# Patient Record
Sex: Female | Born: 2000 | Race: White | Hispanic: No | Marital: Single | Smoking: Never smoker
Health system: Southern US, Community
[De-identification: ages and names within clinical notes are randomized; demographics above are authoritative.]

## PROBLEM LIST (undated history)

## (undated) DIAGNOSIS — K219 Gastro-esophageal reflux disease without esophagitis: Secondary | ICD-10-CM

## (undated) DIAGNOSIS — J45909 Unspecified asthma, uncomplicated: Secondary | ICD-10-CM

## (undated) DIAGNOSIS — F419 Anxiety disorder, unspecified: Secondary | ICD-10-CM

## (undated) DIAGNOSIS — F32A Depression, unspecified: Secondary | ICD-10-CM

## (undated) DIAGNOSIS — K529 Noninfective gastroenteritis and colitis, unspecified: Secondary | ICD-10-CM

## (undated) HISTORY — DX: Anxiety disorder, unspecified: F41.9

## (undated) HISTORY — PX: WISDOM TOOTH EXTRACTION: SHX21

---
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
## 2021-09-16 ENCOUNTER — Ambulatory Visit
Admission: EM | Admit: 2021-09-16 | Discharge: 2021-09-16 | Disposition: A | Discharge status: Home / Self Care | Payer: Self-pay

## 2021-09-16 ENCOUNTER — Other Ambulatory Visit: Payer: Self-pay

## 2021-09-16 DIAGNOSIS — J029 Acute pharyngitis, unspecified: Secondary | ICD-10-CM | POA: Insufficient documentation

## 2021-09-16 DIAGNOSIS — B9689 Other specified bacterial agents as the cause of diseases classified elsewhere: Secondary | ICD-10-CM | POA: Insufficient documentation

## 2021-09-16 DIAGNOSIS — J028 Acute pharyngitis due to other specified organisms: Secondary | ICD-10-CM | POA: Insufficient documentation

## 2021-09-16 LAB — POCT RAPID STREP A (OFFICE): Rapid Strep A Screen: NEGATIVE

## 2021-09-16 MED ORDER — CEFDINIR 300 MG PO CAPS: 300.0000 mg | ORAL_CAPSULE | Freq: Two times a day (BID) | ORAL | 0 refills | Status: AC

## 2021-09-16 NOTE — ED Provider Notes (Signed)
[facility]    CSN: [id number] Arrival date & time: 09/16/21  0917    HISTORY  No chief complaint on file.  HPI Madison Vega is a 20 y.o. female. Pt reports having white spots on the back of her throat, she also has a sore throat, states she noticed the white patches last night but her throat is been a little sore for a while.  Patient states that around [place], a little over [place] ago[place] a [place] throat so went to a local urgent care, states they diagnosed her with streptococcal pharyngitis provide her with a prescription of azithromycin.  Patient states she never really got completely better.  Patient is afebrile on arrival today.  Vital signs are normal.  The history is provided by the patient.  History reviewed. No pertinent past medical history. There are no problems to display for this patient.  History reviewed. No pertinent surgical history. OB History   No obstetric history on file.    Home Medications    Prior to Admission medications   Medication Sig Start Date End Date Taking? Authorizing Provider  albuterol (VENTOLIN HFA) 108 (90 Base) MCG/ACT inhaler Inhale into the lungs every 6 (six) hours as needed for wheezing or shortness of breath.   Yes [provider]  cefdinir (OMNICEF) 300 MG capsule Take 1 capsule (300 mg total) by mouth 2 (two) times daily for 10 days. 09/16/21 09/26/21 Yes Theadora Rama Scales, PA-C  sertraline (ZOLOFT) 50 MG tablet Take 50 mg by mouth daily.   Yes [provider]  etonogestrel-ethinyl estradiol (NUVARING) 0.12-0.015 MG/24HR vaginal ring SMARTSIG:1 Ring Vaginal Once a Month 08/22/21   [provider]   Family History History reviewed. No pertinent family history. Social History Social History   Tobacco Use   Smoking status: Never   Smokeless tobacco: Never  Vaping Use   Vaping Use: Never used  Substance Use Topics   Alcohol use: Never   Drug use:  Never   Allergies   Patient has no allergy information on record.  Review of Systems Review of Systems Pertinent findings noted in history of present illness.   Physical Exam Triage Vital Signs ED Triage Vitals  Enc Vitals Group     BP 07/15/21 0827 (!) 147/82     Pulse Rate 07/15/21 0827 72     Resp 07/15/21 0827 18     Temp 07/15/21 0827 98.3 F (36.8 C)     Temp Source 07/15/21 0827 Oral     SpO2 07/15/21 0827 98 %     Weight --      Height --      Head Circumference --      Peak Flow --      Pain Score 07/15/21 0826 5     Pain Loc --      Pain Edu? --      Excl. in GC? --   No data found.  Updated Vital Signs BP 126/77 (BP Location: Right Arm)    Pulse 69    Temp 98.7 F (37.1 C) (Oral)    Resp 18    LMP 09/15/2021    SpO2 98%   Physical Exam Constitutional:      General: She is not in acute distress.    Appearance: She is well-developed. She is not ill-appearing or toxic-appearing.  HENT:     Head: Normocephalic and atraumatic.     Salivary Glands: Right salivary  gland is diffusely enlarged and tender. Left salivary gland is diffusely enlarged and tender.     Right Ear: Hearing and external ear normal.     Left Ear: Hearing and external ear normal.     Ears:     Comments: Bilateral EACs with mild erythema, bilateral TMs are normal    Nose: No mucosal edema, congestion or rhinorrhea.     Right Turbinates: Not enlarged, swollen or pale.     Left Turbinates: Not enlarged or swollen.     Right Sinus: No maxillary sinus tenderness or frontal sinus tenderness.     Left Sinus: No maxillary sinus tenderness or frontal sinus tenderness.     Mouth/Throat:     Lips: Pink. No lesions.     Mouth: Mucous membranes are moist. No oral lesions or angioedema.     Dentition: No gingival swelling.     Tongue: No lesions.     Palate: No mass.     Pharynx: Uvula midline. Pharyngeal swelling, oropharyngeal exudate and posterior oropharyngeal erythema present. No uvula swelling.      Tonsils: Tonsillar exudate (Significant white patches) present. 2+ on the right. 2+ on the left.     Comments: Tonsils are cryptic Eyes:     Extraocular Movements: Extraocular movements intact.     Conjunctiva/sclera: Conjunctivae normal.     Pupils: Pupils are equal, round, and reactive to light.  Neck:     Thyroid: No thyroid mass, thyromegaly or thyroid tenderness.     Trachea: Tracheal tenderness present. No abnormal tracheal secretions or tracheal deviation.     Comments: Voice is muffled Cardiovascular:     Rate and Rhythm: Normal rate and regular rhythm.     Pulses: Normal pulses.     Heart sounds: Normal heart sounds, S1 normal and S2 normal. No murmur heard.   No friction rub. No gallop.  Pulmonary:     Effort: Pulmonary effort is normal. No accessory muscle usage, prolonged expiration, respiratory distress or retractions.     Breath sounds: No stridor, decreased air movement or transmitted upper airway sounds. No decreased breath sounds, wheezing, rhonchi or rales.  Abdominal:     General: Bowel sounds are normal.     Palpations: Abdomen is soft.     Tenderness: There is generalized abdominal tenderness. There is no right CVA tenderness, left CVA tenderness or rebound. Negative signs include Murphy's sign.     Hernia: No hernia is present.  Musculoskeletal:        General: No tenderness. Normal range of motion.     Cervical back: Full passive range of motion without pain, normal range of motion and neck supple.     Right lower leg: No edema.     Left lower leg: No edema.  Lymphadenopathy:     Cervical: Cervical adenopathy present.     Right cervical: Superficial cervical adenopathy and deep cervical adenopathy present.     Left cervical: Superficial cervical adenopathy and deep cervical adenopathy present.  Skin:    General: Skin is warm and dry.     Findings: No erythema, lesion or rash.  Neurological:     General: No focal deficit present.     Mental Status: She  is alert and oriented to person, place, and time. Mental status is at baseline.  Psychiatric:        Mood and Affect: Mood normal.        Behavior: Behavior normal.        Thought Content: Thought  content normal.        Judgment: Judgment normal.    Visual Acuity Right Eye Distance:   Left Eye Distance:   Bilateral Distance:    Right Eye Near:   Left Eye Near:    Bilateral Near:     UC Couse / Diagnostics / Procedures:    EKG  Radiology No results found.  Procedures Procedures (including critical care time)  UC Diagnoses / Final Clinical Impressions(s)   I have reviewed the triage vital signs and the nursing notes.  Pertinent labs & imaging results that were available during my care of the patient were reviewed by me and considered in my medical decision making (see chart for details).   Final diagnoses:  Acute pharyngitis, unspecified etiology  Acute bacterial pharyngitis   Patient denies penicillin or amoxicillin allergy.  Patient states when she gets strep they usually give her amoxicillin, patient does not know why [place]  We will provide patient with a prescription for cefdinir today, patient advised to finish all as prescribed.  Patient advised to return for follow-up if she has incomplete resolution of her symptoms.  Rapid strep test today was negative, throat culture will be performed per protocol, patient advised to finish cefdinir even if throat culture is negative if she has immediate relief of her symptoms within the first 2 days of cefdinir.  ED Prescriptions     Medication Sig Dispense Auth. Provider   cefdinir (OMNICEF) 300 MG capsule Take 1 capsule (300 mg total) by mouth 2 (two) times daily for 10 days. 20 capsule Theadora Rama Scales, PA-C      PDMP not reviewed this encounter.  Pending results:  Labs Reviewed  CULTURE, GROUP A STREP [facility])  POCT RAPID STREP A (OFFICE)    Medications Ordered in  UC: Medications - No data to display  Disposition Upon Discharge:  Condition: stable for discharge home Home: take medications as prescribed; routine discharge instructions as discussed; follow up as advised.  Patient presented with an acute illness with associated systemic symptoms and significant discomfort requiring urgent management. In my opinion, this is a condition that a prudent lay person (someone who possesses an average knowledge of health and medicine) may potentially expect to result in complications if not addressed urgently such as respiratory distress, impairment of bodily function or dysfunction of bodily organs.   Routine symptom specific, illness specific and/or disease specific instructions were discussed with the patient and/or caregiver at length.   As such, the patient has been evaluated and assessed, work-up was performed and treatment was provided in alignment with urgent care protocols and evidence based medicine.  Patient/parent/caregiver has been advised that the patient may require follow up for further testing and treatment if the symptoms continue in spite of treatment, as clinically indicated and appropriate.  If the patient was tested for COVID-19, Influenza and/or RSV, then the patient/parent/guardian was advised to isolate at home pending the results of his/her diagnostic coronavirus test and potentially longer if theyre positive. I have also advised pt that if his/her COVID-19 test returns positive, it's recommended to self-isolate for at least 10 days after symptoms first appeared AND until fever-free for 24 hours without fever reducer AND other symptoms have improved or resolved. Discussed self-isolation recommendations as well as instructions for household member/close contacts as per the [facility] and [place] [facility]HS, and also gave patient the COVID packet with this information.  Patient/parent/caregiver has been advised to return to  the [facility] or PCP in 3-5 days if no better;  to PCP or the Emergency Department if new signs and symptoms develop, or if the current signs or symptoms continue to change or worsen for further workup, evaluation and treatment as clinically indicated and appropriate  The patient will follow up with their current PCP if and as advised. If the patient does not currently have a PCP we will assist them in obtaining one.   The patient may need specialty follow up if the symptoms continue, in spite of conservative treatment and management, for further workup, evaluation, consultation and treatment as clinically indicated and appropriate.  Patient/parent/caregiver verbalized understanding and agreement of plan as discussed.  All questions were addressed during visit.  Please see discharge instructions below for further details of plan.  Discharge Instructions:   Discharge Instructions      Based on my physical exam today and the history that you provided, I believe that you have bacterial pharyngitis.  Bacterial pharyngitis is most commonly caused by bacteria called group A Streptococcus but there are many other culprits.     Your rapid strep test today is negative.  Throat culture will be performed per our protocol.  The result of your throat culture will be posted to your MyChart once it is complete, this typically takes 3 to 5 days.     The rapid strep test that we performed in the office only catches 40% of known cases of group A streptococcal pharyngitis.  Additionally, and unfortunately, the throat culture that we perform here in the urgent care only evaluates for group A strep and not any of the other bacteria  that are known to cause bacterial pharyngitis.      Because you have significant swelling of both tonsils, significant redness of both tonsils and white patches on both tonsils, I have prescribed an antibiotic to treat you for bacterial pharyngitis which covers group A strep along with other known causative organisms.  Please take this  antibiotic as prescribed and do not skip any doses.  Once you have been on antibiotics for a full 24 hours, you are no longer considered contagious.       If you receive a phone call telling you that your throat culture is negative, I still strongly recommend that you complete your entire prescription of antibiotics regardless of the result of your throat culture, particularly if you begin to feel better within the first 48 hours of therapy.  The throat culture we perform here at urgent care only tests for group A strep and not any of the other bacteria that can also cause bacterial pharyngitis.    Please follow-up with your primary care provider in the next week to ten days for repeat evaluation if you have not had complete resolution of your symptoms.         This office note has been dictated using Teaching laboratory technician.  Unfortunately, and despite my best efforts, this method of dictation can sometimes lead to occasional typographical or grammatical errors.  I apologize in advance if this occurs.     Theadora Rama Scales, PA-C 09/16/21 1236

## 2021-09-16 NOTE — ED Triage Notes (Signed)
Pt reports having white spots on the back of her throat, she also has a sore throat.  Started: last night

## 2021-09-16 NOTE — Discharge Instructions (Addendum)
Based on my physical exam today and the history that you provided, I believe that you have bacterial pharyngitis.  Bacterial pharyngitis is most commonly caused by bacteria called group A Streptococcus but there are many other culprits.     Your rapid strep test today is negative.  Throat culture will be performed per our protocol.  The result of your throat culture will be posted to your MyChart once it is complete, this typically takes 3 to 5 days.     The rapid strep test that we performed in the office only catches 40% of known cases of group A streptococcal pharyngitis.  Additionally, and unfortunately, the throat culture that we perform here in the urgent care only evaluates for group A strep and not any of the other bacteria  that are known to cause bacterial pharyngitis.      Because you have significant swelling of both tonsils, significant redness of both tonsils and white patches on both tonsils, I have prescribed an antibiotic to treat you for bacterial pharyngitis which covers group A strep along with other known causative organisms.  Please take this antibiotic as prescribed and do not skip any doses.  Once you have been on antibiotics for a full 24 hours, you are no longer considered contagious.       If you receive a phone call telling you that your throat culture is negative, I still strongly recommend that you complete your entire prescription of antibiotics regardless of the result of your throat culture, particularly if you begin to feel better within the first 48 hours of therapy.  The throat culture we perform here at urgent care only tests for group A strep and not any of the other bacteria that can also cause bacterial pharyngitis.    Please follow-up with your primary care provider in the next week to ten days for repeat evaluation if you have not had complete resolution of your symptoms.

## 2021-09-18 LAB — CULTURE, GROUP A STREP (THRC)

## 2021-11-03 ENCOUNTER — Other Ambulatory Visit: Payer: Self-pay

## 2021-11-03 ENCOUNTER — Emergency Department (HOSPITAL_BASED_OUTPATIENT_CLINIC_OR_DEPARTMENT_OTHER)

## 2021-11-03 ENCOUNTER — Emergency Department (HOSPITAL_BASED_OUTPATIENT_CLINIC_OR_DEPARTMENT_OTHER)
Admission: EM | Admit: 2021-11-03 | Discharge: 2021-11-03 | Disposition: A | Discharge status: Home / Self Care | Attending: Emergency Medicine | Admitting: Emergency Medicine

## 2021-11-03 ENCOUNTER — Encounter (HOSPITAL_BASED_OUTPATIENT_CLINIC_OR_DEPARTMENT_OTHER): Payer: Self-pay | Admitting: Emergency Medicine

## 2021-11-03 DIAGNOSIS — R519 Headache, unspecified: Secondary | ICD-10-CM | POA: Diagnosis not present

## 2021-11-03 DIAGNOSIS — R1084 Generalized abdominal pain: Secondary | ICD-10-CM | POA: Insufficient documentation

## 2021-11-03 DIAGNOSIS — R197 Diarrhea, unspecified: Secondary | ICD-10-CM | POA: Diagnosis not present

## 2021-11-03 DIAGNOSIS — D72829 Elevated white blood cell count, unspecified: Secondary | ICD-10-CM | POA: Insufficient documentation

## 2021-11-03 DIAGNOSIS — J3489 Other specified disorders of nose and nasal sinuses: Secondary | ICD-10-CM | POA: Diagnosis not present

## 2021-11-03 DIAGNOSIS — R112 Nausea with vomiting, unspecified: Secondary | ICD-10-CM | POA: Diagnosis not present

## 2021-11-03 DIAGNOSIS — Z20822 Contact with and (suspected) exposure to covid-19: Secondary | ICD-10-CM | POA: Diagnosis not present

## 2021-11-03 DIAGNOSIS — E876 Hypokalemia: Secondary | ICD-10-CM | POA: Insufficient documentation

## 2021-11-03 HISTORY — DX: Unspecified asthma, uncomplicated: J45.909

## 2021-11-03 HISTORY — DX: Depression, unspecified: F32.A

## 2021-11-03 LAB — COMPREHENSIVE METABOLIC PANEL
ALT: 17 U/L (ref 0–44)
AST: 15 U/L (ref 15–41)
Albumin: 4.3 g/dL (ref 3.5–5.0)
Alkaline Phosphatase: 69 U/L (ref 38–126)
Anion gap: 14 (ref 5–15)
BUN: 18 mg/dL (ref 6–20)
CO2: 20 mmol/L — ABNORMAL LOW (ref 22–32)
Calcium: 9.5 mg/dL (ref 8.9–10.3)
Chloride: 105 mmol/L (ref 98–111)
Creatinine, Ser: 0.76 mg/dL (ref 0.44–1.00)
GFR, Estimated: >60 mL/min (ref >60)
Glucose, Bld: 114 mg/dL — ABNORMAL HIGH (ref 70–99)
Potassium: 3.4 mmol/L — ABNORMAL LOW (ref 3.5–5.1)
Sodium: 139 mmol/L (ref 135–145)
Total Bilirubin: 0.6 mg/dL (ref 0.3–1.2)
Total Protein: 7.4 g/dL (ref 6.5–8.1)

## 2021-11-03 LAB — URINALYSIS, ROUTINE W REFLEX MICROSCOPIC
Bilirubin Urine: NEGATIVE
Glucose, UA: NEGATIVE mg/dL
Ketones, ur: NEGATIVE mg/dL
Nitrite: NEGATIVE
Specific Gravity, Urine: 1.034 — ABNORMAL HIGH (ref 1.005–1.030)
pH: 6 (ref 5.0–8.0)

## 2021-11-03 LAB — CBC
HCT: 42.3 % (ref 36.0–46.0)
Hemoglobin: 14.2 g/dL (ref 12.0–15.0)
MCH: 28.2 pg (ref 26.0–34.0)
MCHC: 33.6 g/dL (ref 30.0–36.0)
MCV: 84.1 fL (ref 80.0–100.0)
Platelets: 330 10*3/uL (ref 150–400)
RBC: 5.03 MIL/uL (ref 3.87–5.11)
RDW: 12.7 % (ref 11.5–15.5)
WBC: 14 10*3/uL — ABNORMAL HIGH (ref 4.0–10.5)
nRBC: 0 % (ref 0.0–0.2)

## 2021-11-03 LAB — LIPASE, BLOOD: Lipase: <10 U/L — ABNORMAL LOW (ref 11–51)

## 2021-11-03 LAB — RESP PANEL BY RT-PCR (FLU A&B, COVID) ARPGX2
Influenza A by PCR: NEGATIVE
Influenza B by PCR: NEGATIVE
SARS Coronavirus 2 by RT PCR: NEGATIVE

## 2021-11-03 LAB — HCG, SERUM, QUALITATIVE: Preg, Serum: NEGATIVE

## 2021-11-03 MED ORDER — SODIUM CHLORIDE 0.9 % IV BOLUS
1000.0000 mL | Freq: Once | INTRAVENOUS | Status: AC
  Administered 2021-11-03: 1000 mL via INTRAVENOUS

## 2021-11-03 MED ORDER — IOHEXOL 300 MG/ML  SOLN
85.0000 mL | Freq: Once | INTRAMUSCULAR | Status: AC | PRN
  Administered 2021-11-03: 85 mL via INTRAVENOUS

## 2021-11-03 MED ORDER — ONDANSETRON 4 MG PO TBDP: 4.0000 mg | ORAL_TABLET | Freq: Three times a day (TID) | ORAL | 0 refills | Status: DC | PRN

## 2021-11-03 MED ORDER — ONDANSETRON HCL 4 MG/2ML IJ SOLN
4.0000 mg | Freq: Once | INTRAMUSCULAR | Status: AC
  Administered 2021-11-03: 4 mg via INTRAVENOUS
  Filled 2021-11-03: qty 2

## 2021-11-03 NOTE — ED Provider Notes (Signed)
[facility] EMERGENCY DEPT Provider Note   CSN: [id number] Arrival date & time: 11/03/21  1407     History  Chief Complaint  Patient presents with   Emesis    Madison Vega is a 21 y.o. female.  Here for evaluation nausea and vomiting and feeling unwell.  Symptoms began yesterday while she was at work.  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  Started with headache, chills, generalized abdominal pain as well as multiple episodes of NBNB emesis and diarrhea.  Last episode of emesis this morning however feels dehydrated.  No neck stiffness or neck rigidity.  No chest pain, shortness of breath, back pain, dysuria, hematuria.  No focal abdominal pain.  Denies concerns for pregnancy.  HPI     Home Medications Prior to Admission medications   Medication Sig Start Date End Date Taking? Authorizing Provider  etonogestrel-ethinyl estradiol (NUVARING) 0.12-0.015 MG/24HR vaginal ring SMARTSIG:1 Ring Vaginal Once a Month 08/22/21  Yes [provider]  ondansetron (ZOFRAN-ODT) 4 MG disintegrating tablet Take 1 tablet (4 mg total) by mouth every 8 (eight) hours as needed for nausea or vomiting. 11/03/21  Yes Shelda Truby A, PA-C  sertraline (ZOLOFT) 50 MG tablet Take 50 mg by mouth daily.   Yes [provider]  albuterol (VENTOLIN HFA) 108 (90 Base) MCG/ACT inhaler Inhale into the lungs every 6 (six) hours as needed for wheezing or shortness of breath.    [provider]      Allergies    Patient has no known allergies.    Review of Systems   Review of Systems  Constitutional:  Positive for activity change, appetite change, chills and fatigue.  HENT:  Positive for congestion, postnasal drip and rhinorrhea. Negative for sore throat, trouble swallowing and voice change.   Respiratory: Negative.    Cardiovascular: Negative.   Gastrointestinal:  Positive for abdominal pain (generalized), diarrhea, nausea and vomiting. Negative for  abdominal distention, anal bleeding, blood in stool, constipation and rectal pain.  All other systems reviewed and are negative.  Physical Exam Updated Vital Signs BP 111/60    Pulse (!) 110    Temp 98.3 F (36.8 C) (Oral)    Resp 20    Ht 4\' 11"  (1.499 m)    Wt 63.5 kg    SpO2 97%    BMI 28.28 kg/m  Physical Exam Vitals and nursing note reviewed.  Constitutional:      General: She is not in acute distress.    Appearance: She is well-developed. She is not ill-appearing, toxic-appearing or diaphoretic.  HENT:     Head: Atraumatic.     Nose: Nose normal.  Eyes:     Pupils: Pupils are equal, round, and reactive to light.  Cardiovascular:     Rate and Rhythm: Normal rate.     Pulses: Normal pulses.     Heart sounds: Normal heart sounds.  Pulmonary:     Effort: Pulmonary effort is normal. No respiratory distress.     Breath sounds: Normal breath sounds.  Abdominal:     General: Bowel sounds are normal. There is no distension.     Palpations: Abdomen is soft. There is no mass.     Tenderness: There is no abdominal tenderness. There is no right CVA tenderness, left CVA tenderness, guarding or rebound.     Hernia: No hernia is present.  Musculoskeletal:        General: No swelling, tenderness, deformity or signs of injury. Normal  range of motion.     Cervical back: Normal range of motion.     Right lower leg: No edema.     Left lower leg: No edema.  Skin:    General: Skin is warm and dry.     Capillary Refill: Capillary refill takes less than 2 seconds.  Neurological:     General: No focal deficit present.     Mental Status: She is alert and oriented to person, place, and time.     Cranial Nerves: No cranial nerve deficit.     Sensory: Sensation is intact.     Motor: Motor function is intact. No weakness.     Coordination: Coordination is intact.     Gait: Gait is intact.     Comments: CN 2-12 grossly intact Ambulatory without ataxic gait Intact sensation Equal strength   Psychiatric:        Mood and Affect: Mood normal.    ED Results / Procedures / Treatments   Labs (all labs ordered are listed, but only abnormal results are displayed) Labs Reviewed  LIPASE, BLOOD - Abnormal; Notable for the following components:      Result Value   Lipase <10 (*)    All other components within normal limits  COMPREHENSIVE METABOLIC PANEL - Abnormal; Notable for the following components:   Potassium 3.4 (*)    CO2 20 (*)    Glucose, Bld 114 (*)    All other components within normal limits  CBC - Abnormal; Notable for the following components:   WBC 14.0 (*)    All other components within normal limits  URINALYSIS, ROUTINE W REFLEX MICROSCOPIC - Abnormal; Notable for the following components:   Specific Gravity, Urine 1.034 (*)    Hgb urine dipstick TRACE (*)    Protein, ur TRACE (*)    Leukocytes,Ua SMALL (*)    All other components within normal limits  RESP PANEL BY RT-PCR (FLU A&B, COVID) ARPGX2  HCG, SERUM, QUALITATIVE    EKG None  Radiology CT Abdomen Pelvis W Contrast  Result Date: 11/03/2021 CLINICAL DATA:  Abdominal pain, acute, nonlocalized abd pain, emesis, fever EXAM: CT ABDOMEN AND PELVIS WITH CONTRAST TECHNIQUE: Multidetector CT imaging of the abdomen and pelvis was performed using the standard protocol following bolus administration of intravenous contrast. RADIATION DOSE REDUCTION: This exam was performed according to the departmental dose-optimization program which includes automated exposure control, adjustment of the mA and/or kV according to patient size and/or use of iterative reconstruction technique. CONTRAST:  32mL OMNIPAQUE IOHEXOL 300 MG/ML  SOLN COMPARISON:  None. FINDINGS: Lower chest: No acute abnormality. Hepatobiliary: No focal liver abnormality is seen. The gallbladder is unremarkable. Pancreas: Unremarkable. No pancreatic ductal dilatation or surrounding inflammatory changes. Spleen: Normal in size without focal abnormality.  Adrenals/Urinary Tract: Adrenal glands are unremarkable. No hydronephrosis or nephrolithiasis. The bladder is minimally distended. Stomach/Bowel: The stomach is within normal limits. There is no evidence of bowel obstruction.The appendix is normal. Vascular/Lymphatic: No significant vascular findings are present. No enlarged abdominal or pelvic lymph nodes. Reproductive: Unremarkable for age. Other: No abdominal wall hernia or abnormality. No abdominopelvic ascites. Musculoskeletal: No acute or significant osseous findings. IMPRESSION: No acute abdominopelvic abnormality.  Normal appendix. Electronically Signed   By: Maurine Simmering M.D.   On: 11/03/2021 16:52    Procedures Procedures    Medications Ordered in ED Medications  ondansetron (ZOFRAN) injection 4 mg (4 mg Intravenous Given 11/03/21 1435)  sodium chloride 0.9 % bolus 1,000 mL (1,000 mLs Intravenous  New Bag/Given 11/03/21 1436)  ondansetron (ZOFRAN) injection 4 mg (4 mg Intravenous Given 11/03/21 1600)  iohexol (OMNIPAQUE) 300 MG/ML solution 85 mL (85 mLs Intravenous Contrast Given 11/03/21 1645)    ED Course/ Medical Decision Making/ A&P    21 year old here for evaluation of feeling unwell which began yesterday.  She is afebrile, nonseptic, not ill-appearing.  Patient with headache, low-grade fever up to 100.3, nausea, vomiting, generalized abdominal pain, diarrhea, congestion and rhinorrhea.  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  No sudden onset thunderclap headache, neck stiffness, neck rigidity, have low suspicion for meningitis.  Her heart and lungs are clear.  Her abdomen is soft, nontender.  She denies any urinary complaints.  No obvious rash or lesion.  We will plan on labs, imaging and reassess  Labs personally viewed and interpreted: CBC leukocytosis at 14.0 CMP mild hypokalemia at 3.4 Lipase <10 UA negative for infection Preg negative COVID/Flu neg CTAP without acute abnormality  Patient reassessed.  Had  some persistent nausea, dry heaving, will give additional antiemetic.  While she did report an earlier mild headache I have low suspicion for acute intracranial abnormality as cause of her emesis.  Patient reassessed.  Symptoms improved, tolerating p.o. intake.  Suspect she likely has a viral illness.  I discussed her labs and imaging with her which were reassuring thus far.  We will treat symptomatically at home, will have her return for new or worsening symptoms otherwise follow-up with primary care provider  Patient is nontoxic, nonseptic appearing, in no apparent distress.  Patient's pain and other symptoms adequately managed in emergency department.  Fluid bolus given.  Labs, imaging and vitals reviewed.  Patient does not meet the SIRS or Sepsis criteria.  On repeat exam patient does not have a surgical abdomin and there are no peritoneal signs.  No indication of appendicitis, bowel obstruction, bowel perforation, cholecystitis, diverticulitis, PID, TOA, torsion or ectopic pregnancy.  Patient discharged home with symptomatic treatment and given strict instructions for follow-up with their primary care physician.  I have also discussed reasons to return immediately to the ER.  Patient expresses understanding and agrees with plan.                            Medical Decision Making Amount and/or Complexity of Data Reviewed External Data Reviewed: labs and notes. Labs: ordered. Decision-making details documented in ED Course. Radiology: ordered and independent interpretation performed. Decision-making details documented in ED Course.  Risk OTC drugs. Prescription drug management. Risk Details: Do not feel she needs additional labs, imaging, hospitalization at that time.           Final Clinical Impression(s) / ED Diagnoses Final diagnoses:  Nausea vomiting and diarrhea    Rx / DC Orders ED Discharge Orders          Ordered    ondansetron (ZOFRAN-ODT) 4 MG disintegrating tablet   Every 8 hours PRN        11/03/21 1729              Velmer Woelfel A, PA-C 11/03/21 1733    Long, Wonda Olds, MD 11/04/21 [phone]

## 2021-11-03 NOTE — ED Triage Notes (Signed)
Pt arrives to ED with c/o emesis. This started yesterday when pt left work. She does reports she had a migraine before the vomiting started. She reports >10 episodes. Associated symptoms include fever, nausea, diarrhea.

## 2021-11-03 NOTE — Discharge Instructions (Signed)
It was a pleasure taking care of you here in the emergency department today  Your work-up today was reassuring  I prescribed you some Zofran, take as needed for vomiting.  Return for new or worsening symptoms

## 2021-11-03 NOTE — ED Notes (Signed)
Patient transported to CT 

## 2021-11-03 NOTE — ED Notes (Signed)
Dc instructions and script reviewed with pt no questions or concerns at this time. Will follow up with pcp as needed.

## 2021-12-14 ENCOUNTER — Ambulatory Visit (HOSPITAL_COMMUNITY): Payer: Self-pay

## 2021-12-21 ENCOUNTER — Ambulatory Visit: Payer: Self-pay

## 2022-01-16 ENCOUNTER — Ambulatory Visit (HOSPITAL_COMMUNITY): Payer: Self-pay

## 2022-05-12 ENCOUNTER — Encounter (HOSPITAL_BASED_OUTPATIENT_CLINIC_OR_DEPARTMENT_OTHER): Payer: Self-pay

## 2022-05-12 ENCOUNTER — Other Ambulatory Visit: Payer: Self-pay

## 2022-05-12 ENCOUNTER — Emergency Department (HOSPITAL_BASED_OUTPATIENT_CLINIC_OR_DEPARTMENT_OTHER)
Admission: EM | Admit: 2022-05-12 | Discharge: 2022-05-12 | Disposition: A | Discharge status: Home / Self Care | Payer: No Typology Code available for payment source | Attending: Emergency Medicine | Admitting: Emergency Medicine

## 2022-05-12 ENCOUNTER — Emergency Department (HOSPITAL_BASED_OUTPATIENT_CLINIC_OR_DEPARTMENT_OTHER): Payer: No Typology Code available for payment source | Admitting: Radiology

## 2022-05-12 DIAGNOSIS — S43401A Unspecified sprain of right shoulder joint, initial encounter: Secondary | ICD-10-CM | POA: Diagnosis not present

## 2022-05-12 DIAGNOSIS — J45909 Unspecified asthma, uncomplicated: Secondary | ICD-10-CM | POA: Diagnosis not present

## 2022-05-12 DIAGNOSIS — Y990 Civilian activity done for income or pay: Secondary | ICD-10-CM | POA: Diagnosis not present

## 2022-05-12 DIAGNOSIS — X501XXA Overexertion from prolonged static or awkward postures, initial encounter: Secondary | ICD-10-CM | POA: Insufficient documentation

## 2022-05-12 DIAGNOSIS — Y9302 Activity, running: Secondary | ICD-10-CM | POA: Diagnosis not present

## 2022-05-12 DIAGNOSIS — S4991XA Unspecified injury of right shoulder and upper arm, initial encounter: Secondary | ICD-10-CM | POA: Diagnosis present

## 2022-05-12 MED ORDER — KETOROLAC TROMETHAMINE 30 MG/ML IJ SOLN
30.0000 mg | Freq: Once | INTRAMUSCULAR | Status: AC
  Administered 2022-05-12: 30 mg via INTRAMUSCULAR
  Filled 2022-05-12: qty 1
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
## 2022-05-12 NOTE — Discharge Instructions (Signed)
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
## 2022-05-15 DIAGNOSIS — M25511 Pain in right shoulder: Secondary | ICD-10-CM | POA: Insufficient documentation

## 2022-05-17 ENCOUNTER — Ambulatory Visit

## 2022-07-27 DIAGNOSIS — F32A Depression, unspecified: Secondary | ICD-10-CM | POA: Insufficient documentation

## 2022-08-04 ENCOUNTER — Encounter: Payer: Self-pay | Admitting: Physician Assistant

## 2022-09-07 ENCOUNTER — Emergency Department (HOSPITAL_BASED_OUTPATIENT_CLINIC_OR_DEPARTMENT_OTHER)
Admission: EM | Admit: 2022-09-07 | Discharge: 2022-09-08 | Disposition: A | Discharge status: Home / Self Care | Payer: Self-pay | Attending: Emergency Medicine | Admitting: Emergency Medicine

## 2022-09-07 ENCOUNTER — Encounter (HOSPITAL_BASED_OUTPATIENT_CLINIC_OR_DEPARTMENT_OTHER): Payer: Self-pay

## 2022-09-07 ENCOUNTER — Other Ambulatory Visit: Payer: Self-pay

## 2022-09-07 DIAGNOSIS — J45909 Unspecified asthma, uncomplicated: Secondary | ICD-10-CM | POA: Insufficient documentation

## 2022-09-07 DIAGNOSIS — D72829 Elevated white blood cell count, unspecified: Secondary | ICD-10-CM | POA: Insufficient documentation

## 2022-09-07 DIAGNOSIS — K529 Noninfective gastroenteritis and colitis, unspecified: Secondary | ICD-10-CM | POA: Insufficient documentation

## 2022-09-07 DIAGNOSIS — Z7951 Long term (current) use of inhaled steroids: Secondary | ICD-10-CM | POA: Insufficient documentation

## 2022-09-07 LAB — URINALYSIS, ROUTINE W REFLEX MICROSCOPIC
Bilirubin Urine: NEGATIVE
Glucose, UA: NEGATIVE mg/dL
Hgb urine dipstick: NEGATIVE
Ketones, ur: NEGATIVE mg/dL
Nitrite: NEGATIVE
Specific Gravity, Urine: 1.031 — ABNORMAL HIGH (ref 1.005–1.030)
pH: 7 (ref 5.0–8.0)

## 2022-09-07 LAB — COMPREHENSIVE METABOLIC PANEL
ALT: 24 U/L (ref 0–44)
AST: 20 U/L (ref 15–41)
Albumin: 4.2 g/dL (ref 3.5–5.0)
Alkaline Phosphatase: 83 U/L (ref 38–126)
Anion gap: 12 (ref 5–15)
BUN: 10 mg/dL (ref 6–20)
CO2: 24 mmol/L (ref 22–32)
Calcium: 9.3 mg/dL (ref 8.9–10.3)
Chloride: 104 mmol/L (ref 98–111)
Creatinine, Ser: 0.78 mg/dL (ref 0.44–1.00)
GFR, Estimated: >60 mL/min (ref >60)
Glucose, Bld: 100 mg/dL — ABNORMAL HIGH (ref 70–99)
Potassium: 3.7 mmol/L (ref 3.5–5.1)
Sodium: 140 mmol/L (ref 135–145)
Total Bilirubin: 0.5 mg/dL (ref 0.3–1.2)
Total Protein: 7.2 g/dL (ref 6.5–8.1)

## 2022-09-07 LAB — CBC
HCT: 39.9 % (ref 36.0–46.0)
Hemoglobin: 13.2 g/dL (ref 12.0–15.0)
MCH: 27.3 pg (ref 26.0–34.0)
MCHC: 33.1 g/dL (ref 30.0–36.0)
MCV: 82.4 fL (ref 80.0–100.0)
Platelets: 369 10*3/uL (ref 150–400)
RBC: 4.84 MIL/uL (ref 3.87–5.11)
RDW: 12.6 % (ref 11.5–15.5)
WBC: 12 10*3/uL — ABNORMAL HIGH (ref 4.0–10.5)
nRBC: 0 % (ref 0.0–0.2)

## 2022-09-07 LAB — LIPASE, BLOOD: Lipase: 14 U/L (ref 11–51)

## 2022-09-07 LAB — PREGNANCY, URINE: Preg Test, Ur: NEGATIVE

## 2022-09-07 MED ORDER — KETOROLAC TROMETHAMINE 30 MG/ML IJ SOLN
30.0000 mg | Freq: Once | INTRAMUSCULAR | Status: AC
  Administered 2022-09-07: 30 mg via INTRAVENOUS

## 2022-09-07 MED ORDER — ONDANSETRON HCL 4 MG/2ML IJ SOLN
4.0000 mg | Freq: Once | INTRAMUSCULAR | Status: AC
  Administered 2022-09-07: 4 mg via INTRAVENOUS
  Filled 2022-09-07: qty 2

## 2022-09-07 MED ORDER — SODIUM CHLORIDE 0.9 % IV BOLUS
1000.0000 mL | Freq: Once | INTRAVENOUS | Status: AC
  Administered 2022-09-07: 1000 mL via INTRAVENOUS

## 2022-09-07 NOTE — ED Notes (Signed)
Pt vomited x1.  

## 2022-09-07 NOTE — ED Provider Notes (Signed)
[facility] EMERGENCY DEPT Provider Note   CSN: [id number] Arrival date & time: 09/07/22  2216     History  Chief Complaint  Patient presents with   Abdominal Pain    Madison Vega is a 21 y.o. female.  Patient is a 21 year old female with history of asthma.  Patient presenting today with complaints of abdominal pain, nausea, and vomiting.  This started earlier today while having dinner at her parents house.  She describes periumbilical abdominal pain initially but is now located in the right upper and mid abdomen.  She denies any fevers or chills.  She denies any diarrhea or constipation.  There are no aggravating or alleviating factors.  The history is provided by the patient.       Home Medications Prior to Admission medications   Medication Sig Start Date End Date Taking? Authorizing Provider  albuterol (VENTOLIN HFA) 108 (90 Base) MCG/ACT inhaler Inhale into the lungs every 6 (six) hours as needed for wheezing or shortness of breath.    [provider]  etonogestrel-ethinyl estradiol (NUVARING) 0.12-0.015 MG/24HR vaginal ring SMARTSIG:1 Ring Vaginal Once a Month 08/22/21   [provider]  ondansetron (ZOFRAN-ODT) 4 MG disintegrating tablet Take 1 tablet (4 mg total) by mouth every 8 (eight) hours as needed for nausea or vomiting. 11/03/21   Henderly, Britni A, PA-C  sertraline (ZOLOFT) 50 MG tablet Take 50 mg by mouth daily.    [provider]      Allergies    Shellfish allergy    Review of Systems   Review of Systems  All other systems reviewed and are negative.   Physical Exam Updated Vital Signs BP 137/83 (BP Location: Right Arm)   Pulse 80   Temp 98.1 F (36.7 C) (Oral)   Resp 18   Ht 4\' 11"  (1.499 m)   Wt 68 kg   SpO2 100%   BMI 30.30 kg/m  Physical Exam Vitals and nursing note reviewed.  Constitutional:      General: She is not in acute distress.    Appearance: She is well-developed. She is not diaphoretic.   HENT:     Head: Normocephalic and atraumatic.  Cardiovascular:     Rate and Rhythm: Normal rate and regular rhythm.     Heart sounds: No murmur heard.    No friction rub. No gallop.  Pulmonary:     Effort: Pulmonary effort is normal. No respiratory distress.     Breath sounds: Normal breath sounds. No wheezing.  Abdominal:     General: Bowel sounds are normal. There is no distension.     Palpations: Abdomen is soft.     Tenderness: There is abdominal tenderness in the periumbilical area. There is no right CVA tenderness, left CVA tenderness, guarding or rebound.  Musculoskeletal:        General: Normal range of motion.     Cervical back: Normal range of motion and neck supple.  Skin:    General: Skin is warm and dry.  Neurological:     General: No focal deficit present.     Mental Status: She is alert and oriented to person, place, and time.     ED Results / Procedures / Treatments   Labs (all labs ordered are listed, but only abnormal results are displayed) Labs Reviewed  COMPREHENSIVE METABOLIC PANEL - Abnormal; Notable for the following components:      Result Value   Glucose, Bld 100 (*)    All other components within normal  limits  CBC - Abnormal; Notable for the following components:   WBC 12.0 (*)    All other components within normal limits  LIPASE, BLOOD  URINALYSIS, ROUTINE W REFLEX MICROSCOPIC  PREGNANCY, URINE    EKG None  Radiology No results found.  Procedures Procedures    Medications Ordered in ED Medications  sodium chloride 0.9 % bolus 1,000 mL (has no administration in time range)  ondansetron (ZOFRAN) injection 4 mg (has no administration in time range)  ketorolac (TORADOL) 30 MG/ML injection 30 mg (has no administration in time range)    ED Course/ Medical Decision Making/ A&P  Patient is a 21 year old female presenting with complaints of abdominal pain, nausea, and vomiting since earlier today.  She arrives here with stable vital  signs and is afebrile.  There is tenderness to palpation in the periumbilical region and right abdomen.  Physical examination otherwise unremarkable.  Workup initiated including CBC, metabolic panel, urinalysis, and urine pregnancy test.  There is a mild leukocytosis of 12,000, but laboratory studies otherwise unremarkable.  Pregnancy test is negative.  Given the white count and right-sided abdominal pain, a CT scan of the abdomen was obtained which shows no evidence for appendicitis or other acute intra-abdominal process.  She has been hydrated with normal saline and given Toradol and Zofran for pain and nausea.  She is now tolerating p.o. without difficulty.  I feel this the patient can safely be discharged with Zofran and as needed return.  Symptoms most likely viral or foodborne.  Highly doubt ovarian torsion, appendicitis, or cholecystitis.  Final Clinical Impression(s) / ED Diagnoses Final diagnoses:  None    Rx / DC Orders ED Discharge Orders     None         Geoffery Lyons, MD 09/08/22 [phone]

## 2022-09-07 NOTE — ED Triage Notes (Signed)
Patient here POV from Home.  Endorses at 1400 eating when she began to have ABD Pain and nausea. Went to sleep and her Headache subsided but her ABD Pain continued and she also had some nausea.  No Emesis. No Diarrhea. No Fevers.   NAD Noted during Triage. A&Ox4. GCS 15. Ambulatory

## 2022-09-08 ENCOUNTER — Emergency Department (HOSPITAL_BASED_OUTPATIENT_CLINIC_OR_DEPARTMENT_OTHER): Payer: Self-pay

## 2022-09-08 ENCOUNTER — Ambulatory Visit: Payer: Self-pay | Admitting: Physician Assistant

## 2022-09-08 MED ORDER — IOHEXOL 300 MG/ML  SOLN
100.0000 mL | Freq: Once | INTRAMUSCULAR | Status: AC | PRN
  Administered 2022-09-08: 80 mL via INTRAVENOUS

## 2022-09-08 MED ORDER — ONDANSETRON 8 MG PO TBDP: ORAL_TABLET | ORAL | 0 refills | Status: DC

## 2022-09-08 NOTE — ED Notes (Signed)
Pt was given ginger-ale for PO challenge

## 2022-09-08 NOTE — Discharge Instructions (Signed)
Drink plenty of fluids and get plenty of rest.  Take Zofran as prescribed as needed for nausea.  Return to the emergency department if you develop severe abdominal pain, bloody stools, high fevers, or for other new and concerning symptoms.

## 2022-09-13 ENCOUNTER — Ambulatory Visit: Payer: Self-pay | Admitting: Nurse Practitioner

## 2022-10-05 ENCOUNTER — Encounter: Payer: Self-pay | Admitting: Nurse Practitioner

## 2022-10-05 ENCOUNTER — Other Ambulatory Visit (INDEPENDENT_AMBULATORY_CARE_PROVIDER_SITE_OTHER)

## 2022-10-05 ENCOUNTER — Ambulatory Visit: Admitting: Nurse Practitioner

## 2022-10-05 VITALS — BP 112/70 | HR 85 | Ht 59.0 in | Wt 161.0 lb

## 2022-10-05 DIAGNOSIS — R197 Diarrhea, unspecified: Secondary | ICD-10-CM

## 2022-10-05 DIAGNOSIS — K219 Gastro-esophageal reflux disease without esophagitis: Secondary | ICD-10-CM | POA: Insufficient documentation

## 2022-10-05 LAB — TSH: TSH: 3.91 u[IU]/mL (ref 0.35–5.50)

## 2022-10-05 MED ORDER — FAMOTIDINE 20 MG PO TABS: 20.0000 mg | ORAL_TABLET | Freq: Every day | ORAL | 1 refills | Status: DC

## 2022-10-05 NOTE — Progress Notes (Signed)
Agree with assessment/plan.  Raj Jamaurion Slemmer, MD [facility] [phone]  

## 2022-10-05 NOTE — Progress Notes (Signed)
10/05/2022 Madison Vega [id number] Jan 27, 2001   CHIEF COMPLAINT: GERD  HISTORY OF PRESENT ILLNESS: Madison Vega is a 22 year old female with a past medical history of asthma and depression.  Past wisdom teeth extraction surgery.  She presents to our office today as referred by Lucile Crater, NP for further evaluation regarding persistent GERD symptoms.  She describes having bad heartburn which started July 2023.  She took Omeprazole for few weeks without improvement.  She saw her PCP September 2023 and H. pylori breath test off PPI was reported as negative.  She was prescribed Esomeprazole 40 mg 1 p.o. daily and her heartburn symptoms improved but did not abate.  She continues to have heartburn most days.  No dysphagia or upper abdominal pain.  No NSAID use.  She avoids spicy and fatty foods.  She also has intermittent sporadic diarrhea.  She describes having 2-3 episodes of nonbloody diarrhea approximately 3 days weekly since July 2023.  No significant abdominal pain but has mild abdominal cramping at times.  The other days of the week, she passes a normal formed solid stool.  No rectal bleeding or black stools.  She occasionally eats cheese and less frequently drinks milk.  She does not drink sodas or coffee.  She was seen in the ED 09/07/2022 due to having N/V, RUQ pain and dizziness.  WBC count was 12.  Hemoglobin 13.2.  Normal CMP.  CTAP with contrast was unrevealing.  She was diagnosed with gastroenteritis and her acute symptoms abated within 24 hours.  She is a EMT and works night shift.      Latest Ref Rng & Units 09/07/2022   10:30 PM 11/03/2021    2:20 PM  CBC  WBC 4.0 - 10.5 K/uL 12.0  14.0   Hemoglobin 12.0 - 15.0 g/dL 62.9  47.6   Hematocrit 36.0 - 46.0 % 39.9  42.3   Platelets 150 - 400 K/uL 369  330        Latest Ref Rng & Units 09/07/2022   10:30 PM 11/03/2021    2:20 PM  CMP  Glucose 70 - 99 mg/dL 546  503   BUN 6 - 20 mg/dL 10  18   Creatinine 5.46 - 1.00 mg/dL 5.68  1.27    Sodium 517 - 145 mmol/L 140  139   Potassium 3.5 - 5.1 mmol/L 3.7  3.4   Chloride 98 - 111 mmol/L 104  105   CO2 22 - 32 mmol/L 24  20   Calcium 8.9 - 10.3 mg/dL 9.3  9.5   Total Protein 6.5 - 8.1 g/dL 7.2  7.4   Total Bilirubin 0.3 - 1.2 mg/dL 0.5  0.6   Alkaline Phos 38 - 126 U/L 83  69   AST 15 - 41 U/L 20  15   ALT 0 - 44 U/L 24  17     CTAP with contrast 09/08/2022: COMPARISON:  CT abdomen pelvis 11/03/2021   FINDINGS: Lower chest: No acute abnormality.   Hepatobiliary: No focal liver abnormality. No gallstones, gallbladder wall thickening, or pericholecystic fluid. No biliary dilatation.   Pancreas: No focal lesion. Normal pancreatic contour. No surrounding inflammatory changes. No main pancreatic ductal dilatation.   Spleen: Normal in size without focal abnormality.  Splenule noted.   Adrenals/Urinary Tract:   No adrenal nodule bilaterally.   Bilateral kidneys enhance symmetrically. A 1.5 cm fluid dense lesion within left kidney likely represents a simple renal cyst. Simple renal cysts, in the absence  of clinically indicated signs/symptoms, require no independent follow-up.   No hydronephrosis. No hydroureter.   The urinary bladder is unremarkable.   Stomach/Bowel: Stomach is within normal limits. No evidence of bowel wall thickening or dilatation. Appendix appears normal.   Vascular/Lymphatic: No abdominal aorta or iliac aneurysm. No abdominal, pelvic, or inguinal lymphadenopathy.   Reproductive: Uterus and bilateral adnexa are unremarkable. NuvaRing noted within the vagina.   Other: No intraperitoneal free fluid. No intraperitoneal free gas. No organized fluid collection.   Musculoskeletal:   No abdominal wall hernia or abnormality.   No suspicious lytic or blastic osseous lesions. No acute displaced fracture.   IMPRESSION: No acute intra-abdominal or intrapelvic abnormality.  Social History: She is single.  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  Nonsmoker. She drinks one mixed drink every other week.  No drug use.  Family History: Paternal grandmother had breast cancer. Paternal great grandmother had colon cancer.  No known family history of celiac disease or IBD.   Allergies  Allergen Reactions   Shellfish Allergy Nausea And Vomiting      Outpatient Encounter Medications as of 10/05/2022  Medication Sig   albuterol (VENTOLIN HFA) 108 (90 Base) MCG/ACT inhaler Inhale into the lungs every 6 (six) hours as needed for wheezing or shortness of breath.   esomeprazole (NEXIUM) 40 MG capsule Take 1 capsule by mouth 2 (two) times daily before a meal.   etonogestrel-ethinyl estradiol (NUVARING) 0.12-0.015 MG/24HR vaginal ring SMARTSIG:1 Ring Vaginal Once a Month   famotidine (PEPCID) 20 MG tablet Take 1 tablet (20 mg total) by mouth at bedtime.   sertraline (ZOLOFT) 50 MG tablet Take 50 mg by mouth daily.   ondansetron (ZOFRAN-ODT) 8 MG disintegrating tablet 8mg  ODT q4 hours prn nausea   No facility-administered encounter medications on file as of 10/05/2022.   REVIEW OF SYSTEMS:  Gen: Denies fever, sweats or chills. No weight loss.  CV: Denies chest pain, palpitations or edema. Resp: Denies cough, shortness of breath of hemoptysis.  GI: See HPI.  GU : Denies urinary burning, blood in urine, increased urinary frequency or incontinence. MS: Denies joint pain, muscles aches or weakness. Derm: Denies rash, itchiness, skin lesions or unhealing ulcers. Psych: + Anxiety and depression.  Heme: Denies bruising, easy bleeding. Neuro:  Denies headaches, dizziness or paresthesias. Endo:  Denies any problems with DM, thyroid or adrenal function.  PHYSICAL EXAM: BP 112/70   Pulse 85   Ht 4\' 11"  (1.499 m)   Wt 161 lb (73 kg)   BMI 32.52 kg/m  On BCPS General: 22 year old female in no acute distress. Head: Normocephalic and atraumatic. Eyes:  Sclerae non-icteric, conjunctive pink. Ears: Normal  auditory acuity. Mouth: Dentition intact. No ulcers or lesions.  Neck: Supple, no lymphadenopathy or thyromegaly.  Lungs: Clear bilaterally to auscultation without wheezes, crackles or rhonchi. Heart: Regular rate and rhythm. No murmur, rub or gallop appreciated.  Abdomen: Soft, nontender, nondistended. No masses. No hepatosplenomegaly. Normoactive bowel sounds x 4 quadrants.  Rectal: Deferred. Musculoskeletal: Symmetrical with no gross deformities. Skin: Warm and dry. No rash or lesions on visible extremities. Extremities: No edema. Neurological: Alert oriented x 4, no focal deficits.  Psychological:  Alert and cooperative. Normal mood and affect.  ASSESSMENT AND PLAN:  68) 22 year old female with GERD symptoms x 6 months, some improvement on Esomeprazole 40mg  QD.  Patient wishes to schedule an EGD as discussed with her PCP. -Continue diet modifications, avoid spicy/greasy foods.  Do not  eat within 3 hours before going to bed. -Continue Esomeprazole 40 mg 1 p.o. taken 30 minutes before breakfast  -EGD benefits and risks discussed including risk with sedation, risk of bleeding, perforation and infection   2) Intermittent nonbloody diarrhea since July 2023 -TTG, IGA, TSH -EGD as ordered above to also include duodenal biopsies to rule out celiac disease -Consider stopping PPI therapy if diarrhea persists -Check GI pathogen panel if daily diarrhea occurs, patient will contact office if diarrhea worsens  Further recommendations to be determined after the above evaluation completed       CC:  Osa Craver, NP

## 2022-10-05 NOTE — Patient Instructions (Addendum)
Your provider has requested that you go to the basement level for lab work before leaving today. Press "B" on the elevator. The lab is located at the first door on the left as you exit the elevator.  We have sent the following medications to your pharmacy for you to pick up at your convenience: Famotidine 20 mg   You have been scheduled for an endoscopy. Please follow written instructions given to you at your visit today. If you use inhalers (even only as needed), please bring them with you on the day of your procedure.   Due to recent changes in healthcare laws, you may see the results of your imaging and laboratory studies on MyChart before your provider has had a chance to review them.  We understand that in some cases there may be results that are confusing or concerning to you. Not all laboratory results come back in the same time frame and the provider may be waiting for multiple results in order to interpret others.  Please give [place] 48 hours in order for your provider to thoroughly review all the results before contacting the office for clarification of your results.    Thank you for trusting me with your gastrointestinal care!   Carl Best, CRNP

## 2022-10-06 LAB — IGA: Immunoglobulin A: 219 mg/dL (ref 47–310)

## 2022-10-06 LAB — TISSUE TRANSGLUTAMINASE ABS,IGG,IGA
(tTG) Ab, IgA: <1 U/mL
(tTG) Ab, IgG: <1 U/mL

## 2022-11-01 ENCOUNTER — Encounter: Payer: Self-pay | Admitting: Gastroenterology

## 2022-11-01 ENCOUNTER — Ambulatory Visit (AMBULATORY_SURGERY_CENTER): Admitting: Gastroenterology

## 2022-11-01 VITALS — BP 109/63 | HR 67 | Temp 97.7°F | Resp 15 | Ht 59.0 in | Wt 161.0 lb

## 2022-11-01 DIAGNOSIS — R12 Heartburn: Secondary | ICD-10-CM

## 2022-11-01 DIAGNOSIS — K219 Gastro-esophageal reflux disease without esophagitis: Secondary | ICD-10-CM

## 2022-11-01 MED ORDER — SODIUM CHLORIDE 0.9 % IV SOLN: 500.0000 mL | INTRAVENOUS | Status: DC

## 2022-11-01 NOTE — Patient Instructions (Signed)
Discharge instructions given. Biopsies taken. Resume previous medications. YOU HAD AN ENDOSCOPIC PROCEDURE TODAY AT THE [facility]:   Refer to the procedure report that was given to you for any specific questions about what was found during the examination.  If the procedure report does not answer your questions, please call your gastroenterologist to clarify.  If you requested that your care partner not be given the details of your procedure findings, then the procedure report has been included in a sealed envelope for you to review at your convenience later.  YOU SHOULD EXPECT: Some feelings of bloating in the abdomen. Passage of more gas than usual.  Walking can help get rid of the air that was put into your GI tract during the procedure and reduce the bloating. If you had a lower endoscopy (such as a colonoscopy or flexible sigmoidoscopy) you may notice spotting of blood in your stool or on the toilet paper. If you underwent a bowel prep for your procedure, you may not have a normal bowel movement for a few days.  Please Note:  You might notice some irritation and congestion in your nose or some drainage.  This is from the oxygen used during your procedure.  There is no need for concern and it should clear up in a day or so.  SYMPTOMS TO REPORT IMMEDIATELY:   Following upper endoscopy (EGD)  Vomiting of blood or coffee ground material  New chest pain or pain under the shoulder blades  Painful or persistently difficult swallowing  New shortness of breath  Fever of 100F or higher  Black, tarry-looking stools  For urgent or emergent issues, a gastroenterologist can be reached at any hour by calling [phone]. Do not use MyChart messaging for urgent concerns.    DIET:  We do recommend a small meal at first, but then you may proceed to your regular diet.  Drink plenty of fluids but you should avoid alcoholic beverages for 24 hours.  ACTIVITY:  You should plan to take it  easy for the rest of today and you should NOT DRIVE or use heavy machinery until tomorrow (because of the sedation medicines used during the test).    FOLLOW UP: Our staff will call the number listed on your records the next business day following your procedure.  We will call around 7:15- 8:00 am to check on you and address any questions or concerns that you may have regarding the information given to you following your procedure. If we do not reach you, we will leave a message.     If any biopsies were taken you will be contacted by phone or by letter within the next 1-3 weeks.  Please call us at [phone] if you have not heard about the biopsies in 3 weeks.    SIGNATURES/CONFIDENTIALITY: You and/or your care partner have signed paperwork which will be entered into your electronic medical record.  These signatures attest to the fact that that the information above on your After Visit Summary has been reviewed and is understood.  Full responsibility of the confidentiality of this discharge information lies with you and/or your care-partner. 

## 2022-11-01 NOTE — Progress Notes (Signed)
Called to room to assist during endoscopic procedure.  Patient ID and intended procedure confirmed with present staff. Received instructions for my participation in the procedure from the performing physician.  

## 2022-11-01 NOTE — Progress Notes (Signed)
10/05/2022 Sherleen Panciera [id number] 2000-11-30     CHIEF COMPLAINT: GERD   HISTORY OF PRESENT ILLNESS: Madison Vega is a 22 year old female with a past medical history of asthma and depression.  Past wisdom teeth extraction surgery.  She presents to our office today as referred by Therese Sarah, NP for further evaluation regarding persistent GERD symptoms.  She describes having bad heartburn which started July 2023.  She took Omeprazole for few weeks without improvement.  She saw her PCP September 2023 and H. pylori breath test off PPI was reported as negative.  She was prescribed Esomeprazole 40 mg 1 p.o. daily and her heartburn symptoms improved but did not abate.  She continues to have heartburn most days.  No dysphagia or upper abdominal pain.  No NSAID use.  She avoids spicy and fatty foods.  She also has intermittent sporadic diarrhea.  She describes having 2-3 episodes of nonbloody diarrhea approximately 3 days weekly since July 2023.  No significant abdominal pain but has mild abdominal cramping at times.  The other days of the week, she passes a normal formed solid stool.  No rectal bleeding or black stools.  She occasionally eats cheese and less frequently drinks milk.  She does not drink sodas or coffee.  She was seen in the ED 09/07/2022 due to having N/V, RUQ pain and dizziness.  WBC count was 12.  Hemoglobin 13.2.  Normal CMP.  CTAP with contrast was unrevealing.  She was diagnosed with gastroenteritis and her acute symptoms abated within 24 hours.  She is a EMT and works night shift.         Latest Ref Rng & Units 09/07/2022   10:30 PM 11/03/2021    2:20 PM  CBC  WBC 4.0 - 10.5 K/uL 12.0  14.0   Hemoglobin 12.0 - 15.0 g/dL 13.2  14.2   Hematocrit 36.0 - 46.0 % 39.9  42.3   Platelets 150 - 400 K/uL 369  330         Latest Ref Rng & Units 09/07/2022   10:30 PM 11/03/2021    2:20 PM  CMP  Glucose 70 - 99 mg/dL 100  114   BUN 6 - 20 mg/dL 10  18   Creatinine 0.44 - 1.00 mg/dL 0.78   0.76   Sodium 135 - 145 mmol/L 140  139   Potassium 3.5 - 5.1 mmol/L 3.7  3.4   Chloride 98 - 111 mmol/L 104  105   CO2 22 - 32 mmol/L 24  20   Calcium 8.9 - 10.3 mg/dL 9.3  9.5   Total Protein 6.5 - 8.1 g/dL 7.2  7.4   Total Bilirubin 0.3 - 1.2 mg/dL 0.5  0.6   Alkaline Phos 38 - 126 U/L 83  69   AST 15 - 41 U/L 20  15   ALT 0 - 44 U/L 24  17     CTAP with contrast 09/08/2022: COMPARISON:  CT abdomen pelvis 11/03/2021   FINDINGS: Lower chest: No acute abnormality.   Hepatobiliary: No focal liver abnormality. No gallstones, gallbladder wall thickening, or pericholecystic fluid. No biliary dilatation.   Pancreas: No focal lesion. Normal pancreatic contour. No surrounding inflammatory changes. No main pancreatic ductal dilatation.   Spleen: Normal in size without focal abnormality.  Splenule noted.   Adrenals/Urinary Tract:   No adrenal nodule bilaterally.   Bilateral kidneys enhance symmetrically. A 1.5 cm fluid dense lesion within left kidney likely represents a simple  renal cyst. Simple renal cysts, in the absence of clinically indicated signs/symptoms, require no independent follow-up.   No hydronephrosis. No hydroureter.   The urinary bladder is unremarkable.   Stomach/Bowel: Stomach is within normal limits. No evidence of bowel wall thickening or dilatation. Appendix appears normal.   Vascular/Lymphatic: No abdominal aorta or iliac aneurysm. No abdominal, pelvic, or inguinal lymphadenopathy.   Reproductive: Uterus and bilateral adnexa are unremarkable. NuvaRing noted within the vagina.   Other: No intraperitoneal free fluid. No intraperitoneal free gas. No organized fluid collection.   Musculoskeletal:   No abdominal wall hernia or abnormality.   No suspicious lytic or blastic osseous lesions. No acute displaced fracture.   IMPRESSION: No acute intra-abdominal or intrapelvic abnormality.   Social History: She is single.  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  Nonsmoker. She drinks one mixed drink every other week.  No drug use.   Family History: Paternal grandmother had breast cancer. Paternal great grandmother had colon cancer.  No known family history of celiac disease or IBD.         Allergies  Allergen Reactions   Shellfish Allergy Nausea And Vomiting            Outpatient Encounter Medications as of 10/05/2022  Medication Sig   albuterol (VENTOLIN HFA) 108 (90 Base) MCG/ACT inhaler Inhale into the lungs every 6 (six) hours as needed for wheezing or shortness of breath.   esomeprazole (NEXIUM) 40 MG capsule Take 1 capsule by mouth 2 (two) times daily before a meal.   etonogestrel-ethinyl estradiol (NUVARING) 0.12-0.015 MG/24HR vaginal ring SMARTSIG:1 Ring Vaginal Once a Month   famotidine (PEPCID) 20 MG tablet Take 1 tablet (20 mg total) by mouth at bedtime.   sertraline (ZOLOFT) 50 MG tablet Take 50 mg by mouth daily.   ondansetron (ZOFRAN-ODT) 8 MG disintegrating tablet 37m ODT q4 hours prn nausea    No facility-administered encounter medications on file as of 10/05/2022.    REVIEW OF SYSTEMS:  Gen: Denies fever, sweats or chills. No weight loss.  CV: Denies chest pain, palpitations or edema. Resp: Denies cough, shortness of breath of hemoptysis.  GI: See HPI.  GU : Denies urinary burning, blood in urine, increased urinary frequency or incontinence. MS: Denies joint pain, muscles aches or weakness. Derm: Denies rash, itchiness, skin lesions or unhealing ulcers. Psych: + Anxiety and depression.  Heme: Denies bruising, easy bleeding. Neuro:  Denies headaches, dizziness or paresthesias. Endo:  Denies any problems with DM, thyroid or adrenal function.   PHYSICAL EXAM: BP 112/70   Pulse 85   Ht 4' 11"$  (1.499 m)   Wt 161 lb (73 kg)   BMI 32.52 kg/m  On BCPS General: 22year old female in no acute distress. Head: Normocephalic and atraumatic. Eyes:  Sclerae non-icteric, conjunctive  pink. Ears: Normal auditory acuity. Mouth: Dentition intact. No ulcers or lesions.  Neck: Supple, no lymphadenopathy or thyromegaly.  Lungs: Clear bilaterally to auscultation without wheezes, crackles or rhonchi. Heart: Regular rate and rhythm. No murmur, rub or gallop appreciated.  Abdomen: Soft, nontender, nondistended. No masses. No hepatosplenomegaly. Normoactive bowel sounds x 4 quadrants.  Rectal: Deferred. Musculoskeletal: Symmetrical with no gross deformities. Skin: Warm and dry. No rash or lesions on visible extremities. Extremities: No edema. Neurological: Alert oriented x 4, no focal deficits.  Psychological:  Alert and cooperative. Normal mood and affect.   ASSESSMENT AND PLAN:   152 22year old female with GERD symptoms x 6 months,  some improvement on Esomeprazole 44m QD.  Patient wishes to schedule an EGD as discussed with her PCP. -Continue diet modifications, avoid spicy/greasy foods.  Do not eat within 3 hours before going to bed. -Continue Esomeprazole 40 mg 1 p.o. taken 30 minutes before breakfast  -EGD benefits and risks discussed including risk with sedation, risk of bleeding, perforation and infection    2) Intermittent nonbloody diarrhea since July 2023 -TTG, IGA, TSH -EGD as ordered above to also include duodenal biopsies to rule out celiac disease -Consider stopping PPI therapy if diarrhea persists -Check GI pathogen panel if daily diarrhea occurs, patient will contact office if diarrhea worsens   Further recommendations to be determined after the above evaluation completed     Attending physician's note   I have taken history, reviewed the chart and examined the patient. I performed a substantive portion of this encounter, including complete performance of at least one of the key components, in conjunction with the APP. I agree with the Advanced Practitioner's note, impression and recommendations.   EGD today.   RCarmell Austria MD [facility] [phone]

## 2022-11-01 NOTE — Op Note (Signed)
[facility] Patient Name: Madison Vega Procedure Date: 11/01/2022 2:01 PM MRN: [id number] Endoscopist: Jackquline Denmark , MD, [id number] Age: 22 Referring MD:  Date of Birth: 06-Nov-2000 Gender: Female Account #: [id number] Procedure:                Upper GI endoscopy Indications:              Heartburn Medicines:                Monitored Anesthesia Care Procedure:                Pre-Anesthesia Assessment:                           - Prior to the procedure, a History and Physical                            was performed, and patient medications and                            allergies were reviewed. The patient's tolerance of                            previous anesthesia was also reviewed. The risks                            and benefits of the procedure and the sedation                            options and risks were discussed with the patient.                            All questions were answered, and informed consent                            was obtained. Prior Anticoagulants: The patient has                            taken no anticoagulant or antiplatelet agents. ASA                            Grade Assessment: II - A patient with mild systemic                            disease. After reviewing the risks and benefits,                            the patient was deemed in satisfactory condition to                            undergo the procedure.                           After obtaining informed consent, the endoscope was  passed under direct vision. Throughout the                            procedure, the patient's blood pressure, pulse, and                            oxygen saturations were monitored continuously. The                            Olympus Scope [id number] was introduced through the                            mouth, and advanced to the second part of duodenum.                            The upper GI endoscopy was accomplished  without                            difficulty. The patient tolerated the procedure                            well. Scope In: Scope Out: Findings:                 The examined esophagus was normal. Biopsies were                            obtained from the proximal and distal esophagus                            with cold forceps for histology of suspected                            eosinophilic esophagitis.                           A small (2 cm) sliding hiatal hernia was present                            extending from 33 up to 35 cm.                           The entire examined stomach was normal. Biopsies                            were taken with a cold forceps for histology.                           The examined duodenum was normal. Biopsies for                            histology were taken with a cold forceps for                            evaluation of celiac disease.  Complications:            No immediate complications. Estimated Blood Loss:     Estimated blood loss: none. Impression:               - Small hiatal hernia.                           - Otherwise normal EGD. Recommendation:           - Patient has a contact number available for                            emergencies. The signs and symptoms of potential                            delayed complications were discussed with the                            patient. Return to normal activities tomorrow.                            Written discharge instructions were provided to the                            patient.                           - Resume previous diet.                           - Continue present medications. Can try omeprazole                            40 mg every other day or 20 mg once a day.                           - Nonpharmacologic means of reflux control.                           - Await pathology results.                           - The findings and recommendations were discussed                             with the patient's family. Jackquline Denmark, MD 11/01/2022 2:26:02 PM This report has been signed electronically.

## 2022-11-01 NOTE — Progress Notes (Signed)
Vss nad trans to pacu °

## 2022-11-02 ENCOUNTER — Telehealth: Payer: Self-pay | Admitting: *Deleted

## 2022-11-02 NOTE — Telephone Encounter (Signed)
Attempted to call patient for their post-procedure follow-up call. No answer. Left voicemail.   

## 2022-11-11 ENCOUNTER — Encounter: Payer: Self-pay | Admitting: Gastroenterology

## 2023-01-04 LAB — HM PAP SMEAR
Chlamydia, Swab/Urine, PCR: NEGATIVE
HM Pap smear: ABNORMAL

## 2023-03-24 ENCOUNTER — Telehealth: Admitting: Nurse Practitioner

## 2023-03-24 ENCOUNTER — Ambulatory Visit (HOSPITAL_COMMUNITY)

## 2023-03-24 DIAGNOSIS — J0100 Acute maxillary sinusitis, unspecified: Secondary | ICD-10-CM | POA: Diagnosis not present

## 2023-03-24 MED ORDER — AMOXICILLIN-POT CLAVULANATE 875-125 MG PO TABS: 1.0000 | ORAL_TABLET | Freq: Two times a day (BID) | ORAL | 0 refills | Status: DC

## 2023-03-24 NOTE — Progress Notes (Signed)
Patient no showed for appointment  Mary-Margaret Daphine Deutscher, FNP

## 2023-03-24 NOTE — Progress Notes (Signed)

## 2023-05-05 ENCOUNTER — Telehealth: Admitting: Nurse Practitioner

## 2023-05-05 DIAGNOSIS — H6990 Unspecified Eustachian tube disorder, unspecified ear: Secondary | ICD-10-CM | POA: Diagnosis not present

## 2023-05-05 MED ORDER — FLUTICASONE PROPIONATE 50 MCG/ACT NA SUSP: 2.0000 | Freq: Every day | NASAL | 6 refills | Status: DC

## 2023-05-05 NOTE — Progress Notes (Signed)
Based on what you shared with me it looks like you have eustachian tube dysfunction,that should be evaluated in a face to face office visit. Sinc this has been going on for 2 months then you need someone to look in your ear andsee what is going on.  NOTE: There will be NO CHARGE for this eVisit   If you are having a true medical emergency please call 911.      For an urgent face to face visit, [place] has six urgent care centers for your convenience:     [facility] at [facility] Directions [phone] [address] [place], [place] [address]    [facility]) Get Driving Directions [phone] [address] [place], [place] [address]  [facility]gent [facility] Center [facility]) Get Driving Directions [phone] [address] [place],  [place]  [address]  [facility]gent Care at [facility] Get Driving Directions [phone] [address], [address] [place], [place] [address]   [facility] Care at [facility] Get Driving Directions  [phone] [address] [place], [place] [address]   [facility] Ur[facility] Care at [facility] Directions [phone] [address], [address] [place], [place] [address]  Your MyChart E-visit questionnaire answers were reviewed by a board certified advanced clinical practitioner to complete your personal care plan based on your specific symptoms.  Thank you for using e-Visits.

## 2023-05-05 NOTE — Progress Notes (Signed)
E-Visit for Ear Pain - Eustachian Tube Dysfunction   We are sorry that you are not feeling well. Here is how we plan to help!  Based on what you have shared with me it looks like you have Eustachian Tube Dysfunction.  Eustachian Tube Dysfunction is a condition where the tubes that connect your middle ears to your upper throat become blocked. This can lead to discomfort, hearing difficulties and a feeling of fullness in your ear. Eustachian tube dysfunction usually resolves itself in a few days. The usual symptoms include: Hearing problems Tinnitus, or ringing in your ears Clicking or popping sounds A feeling of fullness in your ears Pain that mimics an ear infection Dizziness, vertigo or balance problems A "tickling" sensation in your ears  ?Eustachian tube dysfunction symptoms may get worse in higher altitudes. This is called barotrauma, and it can happen while scuba diving, flying in an airplane or driving in the mountains.   What causes eustachian tube dysfunction? Allergies and infections (like the common cold and the flu) are the most common causes of eustachian tube dysfunction. These conditions can cause inflammation and mucus buildup, leading to blockage. GERD, or chronic acid reflux, can also cause ETD. This is because stomach acid can back up into your throat and result in inflammation. As mentioned above, altitude changes can also cause ETD.   What are some common eustachian tube dysfunction treatments? In most cases, treatment isn't necessary because ETD often resolves on its own. However, you might need treatment if your symptoms linger for more than two weeks.    Eustachian tube dysfunction treatment depends on the cause and the severity of your condition. Treatments may include home remedies, medications or, in severe cases, surgery.     HOME CARE: Sometimes simple home remedies can help with mild cases of eustachian tube dysfunction. To try and clear the blockage, you  can: Chew gum. Yawn. Swallow. Try the Valsalva maneuver (breathing out forcefully while closing your mouth and pinching your nostrils). Use a saline spray to clear out nasal passages.  MEDICATIONS: Over-the-counter medications can help if allergies are causing eustachian tube dysfunction. Try antihistamines (like cetirizine or diphenhydramine) to ease your symptoms. If you have discomfort, pain relievers -- such as acetaminophen or ibuprofen -- can help.  Sometimes intranasal glucocorticosteroids (like Flonase or Nasacort) help.  I have prescribed Fluticasone 50 mcg/spray 2 sprays in each nostril daily for 10-14 days    GET HELP RIGHT AWAY IF: Fever is over 102.2 degrees. You develop progressive ear pain or hearing loss. Ear symptoms persist longer than 3 days after treatment.  MAKE SURE YOU: Understand these instructions. Will watch your condition. Will get help right away if you are not doing well or get worse.  Thank you for choosing an e-visit.  Your e-visit answers were reviewed by a board certified advanced clinical practitioner to complete your personal care plan. Depending upon the condition, your plan could have included both over the counter or prescription medications.  Please review your pharmacy choice. Make sure the pharmacy is open so you can pick up the prescription now. If there is a problem, you may contact your provider through MyChart messaging and have the prescription routed to another pharmacy.  Your safety is important to us. If you have drug allergies check your prescription carefully.   For the next 24 hours you can use MyChart to ask questions about today's visit, request a non-urgent call back, or ask for a work or school excuse. You will   get an email with a survey after your eVisit asking about your experience. We would appreciate your feedback. I hope that your e-visit has been valuable and will aid in your recovery.   Mary-Margaret Martin, FNP   5-10  minutes spent reviewing and documenting in chart.    

## 2023-05-10 ENCOUNTER — Ambulatory Visit

## 2023-05-15 ENCOUNTER — Telehealth: Admitting: Nurse Practitioner

## 2023-05-15 DIAGNOSIS — H6690 Otitis media, unspecified, unspecified ear: Secondary | ICD-10-CM | POA: Diagnosis not present

## 2023-05-15 MED ORDER — AMOXICILLIN 875 MG PO TABS: 875.0000 mg | ORAL_TABLET | Freq: Two times a day (BID) | ORAL | 0 refills | Status: DC

## 2023-05-15 NOTE — Progress Notes (Signed)
E-Visit for Ear Pain - Acute Otitis Media   We are sorry that you are not feeling well. Here is how we plan to help!  Based on what you have shared with me it looks like you have Acute Otitis Media.  Acute Otitis Media is an infection of the middle or "inner" ear. This type of infection can cause redness, inflammation, and fluid buildup behind the tympanic membrane (ear drum).  The usual symptoms include: Earache/Pain Fever Upper respiratory symptoms Lack of energy/Fatigue/Malaise Slight hearing loss gradually worsening- if the inner ear fills with fluid What causes middle ear infections? Most middle ear infections occur when an infection such as a cold, leads to a build-up of mucus in the middle ear and causes the Eustachian tube (a thin tube that runs from the middle ear to the back of the nose) to become swollen or blocked.   This means mucus can't drain away properly, making it easier for an infection to spread into the middle ear.  How middle ear infections are treated: Most ear infections clear up within three to five days and don't need any specific treatment. If necessary, tylenol or ibuprofen should be used to relieve pain and a high temperature.  If you develop a fever higher than 102, or any significantly worsening symptoms, this could indicate a more serious infection moving to the middle/inner and needs face to face evaluation in an office by a provider.   Antibiotics aren't routinely used to treat middle ear infections, although they may occasionally be prescribed if symptoms persist or are particularly severe. Given your presentation,   I have prescribed Amoxicillin 875 mg one tablet twice daily for 10 days    Your symptoms should improve over the next 3 days and should resolve in about 7 days. Be sure to complete ALL of the prescription(s) given.  HOME CARE: Wash your hands frequently. If you are prescribed an ear drop, do not place the tip of the bottle on your ear or  touch it with your fingers. You can take Acetaminophen 650 mg every 4-6 hours as needed for pain.  If pain is severe or moderate, you can apply a heating pad (set on low) or hot water bottle (wrapped in a towel) to outer ear for 20 minutes.  This will also increase drainage.  GET HELP RIGHT AWAY IF: Fever is over 102.2 degrees. You develop progressive ear pain or hearing loss. Ear symptoms persist longer than 3 days after treatment.  MAKE SURE YOU: Understand these instructions. Will watch your condition. Will get help right away if you are not doing well or get worse.  Thank you for choosing an e-visit.  Your e-visit answers were reviewed by a board certified advanced clinical practitioner to complete your personal care plan. Depending upon the condition, your plan could have included both over the counter or prescription medications.  Please review your pharmacy choice. Make sure the pharmacy is open so you can pick up the prescription now. If there is a problem, you may contact your provider through [employer] and have the prescription routed to another pharmacy.  Your safety is important to [place]. If you have drug allergies check your prescription carefully.   For the next 24 hours you can use MyChart to ask questions about today's visit, request a non-urgent call back, or ask for a work or school excuse. You will get an email with a survey after your eVisit asking about your experience. We would appreciate your feedback. I  hope that your e-visit has been valuable and will aid in your recovery.  Meds ordered this encounter  Medications   amoxicillin (AMOXIL) 875 MG tablet    Sig: Take 1 tablet (875 mg total) by mouth 2 (two) times daily for 10 days.    Dispense:  20 tablet    Refill:  0     I spent approximately 5 minutes reviewing the patient's history, current symptoms and coordinating their care today.

## 2023-05-23 NOTE — Progress Notes (Unsigned)
New Patient Office Visit  Subjective:   Madison Vega Nov 24, 2000 05/24/2023  No chief complaint on file.   HPI: Curtisa Helmkamp presents today to establish care at [facility] at [facility]. Introduced to Publishing rights manager role and practice setting.  All questions answered.   Last PCP: *** Last annual physical: *** Concerns: See below    The following portions of the patient's history were reviewed and updated as appropriate: past medical history, past surgical history, family history, social history, allergies, medications, and problem list.   Patient Active Problem List   Diagnosis Date Noted   GERD (gastroesophageal reflux disease) 10/05/2022   Past Medical History:  Diagnosis Date   Anxiety    Asthma    Depression    No past surgical history on file. Family History  Problem Relation Age of Onset   Breast cancer Paternal Grandmother    Colon cancer Paternal Great-grandmother    Stomach cancer Neg Hx    Esophageal cancer Neg Hx    Rectal cancer Neg Hx    Social History   Socioeconomic History   Marital status: Single    Spouse name: Not on file   Number of children: 0   Years of education: Not on file   Highest education level: Some college, no degree  Occupational History   Occupation: [employer]  Tobacco Use   Smoking status: Never   Smokeless tobacco: Never  Vaping Use   Vaping status: Every Day  Substance and Sexual Activity   Alcohol use: Yes    Comment: Occ   Drug use: Never   Sexual activity: Not on file  Other Topics Concern   Not on file  Social History Narrative   Not on file   Social Determinants of Health   Financial Resource Strain: Low Risk  (05/23/2023)   Overall Financial Resource Strain (CARDIA)    Difficulty of Paying Living Expenses: Not very hard  Food Insecurity: No Food Insecurity (05/23/2023)   Hunger Vital Sign    Worried About Running Out of Food in the Last Year: Never true    Ran Out  of Food in the Last Year: Never true  Transportation Needs: No Transportation Needs (05/23/2023)   PRAPARE - Administrator, Civil Service (Medical): No    Lack of Transportation (Non-Medical): No  Physical Activity: Insufficiently Active (05/23/2023)   Exercise Vital Sign    Days of Exercise per Week: 3 days    Minutes of Exercise per Session: 30 min  Stress: No Stress Concern Present (05/23/2023)   [employer] of Occupational Health - Occupational Stress Questionnaire    Feeling of Stress : Not at all  Social Connections: Socially Isolated (05/23/2023)   Social Connection and Isolation Panel [NHANES]    Frequency of Communication with Friends and Family: More than three times a week    Frequency of Social Gatherings with Friends and Family: Three times a week    Attends Religious Services: Never    Active Member of Clubs or Organizations: No    Attends Banker Meetings: Not on file    Marital Status: Never married  Intimate Partner Violence: Not At Risk (12/15/2022)   [facility]   HITS    Over the last 12 months how often did your partner physically hurt you?: 1    Over the last 12 months how often did your partner insult you or talk down to you?:  1    Over the last 12 months how often did your partner threaten you with physical harm?: 1    Over the last 12 months how often did your partner scream or curse at you?: 1   Outpatient Medications Prior to Visit  Medication Sig Dispense Refill   albuterol (VENTOLIN HFA) 108 (90 Base) MCG/ACT inhaler Inhale into the lungs every 6 (six) hours as needed for wheezing or shortness of breath.     amoxicillin (AMOXIL) 875 MG tablet Take 1 tablet (875 mg total) by mouth 2 (two) times daily for 10 days. 20 tablet 0   amoxicillin-clavulanate (AUGMENTIN) 875-125 MG tablet Take 1 tablet by mouth 2 (two) times daily. 14 tablet 0   esomeprazole (NEXIUM) 40 MG capsule Take 1 capsule by mouth 2 (two)  times daily before a meal.     etonogestrel-ethinyl estradiol (NUVARING) 0.12-0.015 MG/24HR vaginal ring SMARTSIG:1 Ring Vaginal Once a Month     famotidine (PEPCID) 20 MG tablet Take 1 tablet (20 mg total) by mouth at bedtime. 30 tablet 1   fluticasone (FLONASE) 50 MCG/ACT nasal spray Place 2 sprays into both nostrils daily. 16 g 6   sertraline (ZOLOFT) 50 MG tablet Take 50 mg by mouth daily.     No facility-administered medications prior to visit.   Allergies  Allergen Reactions   Shellfish Allergy Nausea And Vomiting, Other (See Comments) and Itching   Duloxetine Hcl Anxiety    ROS: A complete ROS was performed with pertinent positives/negatives noted in the HPI. The remainder of the ROS are negative.   Objective:   There were no vitals filed for this visit.  GENERAL: Well-appearing, in NAD. Well nourished.  SKIN: Pink, warm and dry. No rash, lesion, ulceration, or ecchymoses.  Head: Normocephalic. NECK: Trachea midline. Full ROM w/o pain or tenderness. No lymphadenopathy.  EARS: Tympanic membranes are intact, translucent without bulging and without drainage. Appropriate landmarks visualized.  EYES: Conjunctiva clear without exudates. EOMI, PERRL, no drainage present.  NOSE: Septum midline w/o deformity. Nares patent, mucosa pink and non-inflamed w/o drainage. No sinus tenderness.  THROAT: Uvula midline. Oropharynx clear. Tonsils non-inflamed without exudate. Mucous membranes pink and moist.  RESPIRATORY: Chest wall symmetrical. Respirations even and non-labored. Breath sounds clear to auscultation bilaterally.  CARDIAC: S1, S2 present, regular rate and rhythm without murmur or gallops. Peripheral pulses 2+ bilaterally.  MSK: Muscle tone and strength appropriate for age. Joints w/o tenderness, redness, or swelling.  EXTREMITIES: Without clubbing, cyanosis, or edema.  NEUROLOGIC: No motor or sensory deficits. Steady, even gait. C2-C12 intact.  PSYCH/MENTAL STATUS: Alert, oriented x  3. Cooperative, appropriate mood and affect.    Health Maintenance Due  Topic Date Due   HPV VACCINES (1 - 3-dose series) Never done   HIV Screening  Never done   Hepatitis C Screening  Never done   DTaP/Tdap/Td (1 - Tdap) Never done   PAP-Cervical Cytology Screening  Never done   PAP SMEAR-Modifier  Never done   INFLUENZA VACCINE  04/19/2023   COVID-19 Vaccine (1 - 2023-24 season) Never done    No results found for any visits on 05/24/23.     Assessment & Plan:  There are no diagnoses linked to this encounter.    Patient to reach out to office if new, worrisome, or unresolved symptoms arise or if no improvement in patient's condition. Patient verbalized understanding and is agreeable to treatment plan. All questions answered to patient's satisfaction.    No follow-ups on file.  Of note, portions of this note may have been created with voice recognition software Physicist, medical). While this note has been edited for accuracy, occasional wrong-word or 'sound-a-like' substitutions may have occurred due to the inherent limitations of voice recognition software.  Yolanda Manges, FNP

## 2023-05-24 ENCOUNTER — Telehealth (HOSPITAL_BASED_OUTPATIENT_CLINIC_OR_DEPARTMENT_OTHER): Payer: Self-pay | Admitting: Family Medicine

## 2023-05-24 ENCOUNTER — Encounter (HOSPITAL_BASED_OUTPATIENT_CLINIC_OR_DEPARTMENT_OTHER): Payer: Self-pay | Admitting: Family Medicine

## 2023-05-24 ENCOUNTER — Ambulatory Visit (HOSPITAL_BASED_OUTPATIENT_CLINIC_OR_DEPARTMENT_OTHER): Admitting: Family Medicine

## 2023-05-24 VITALS — BP 117/77 | HR 85 | Ht 59.0 in | Wt 152.5 lb

## 2023-05-24 DIAGNOSIS — Z30019 Encounter for initial prescription of contraceptives, unspecified: Secondary | ICD-10-CM | POA: Diagnosis not present

## 2023-05-24 DIAGNOSIS — H6591 Unspecified nonsuppurative otitis media, right ear: Secondary | ICD-10-CM | POA: Diagnosis not present

## 2023-05-24 DIAGNOSIS — Z7689 Persons encountering health services in other specified circumstances: Secondary | ICD-10-CM | POA: Diagnosis not present

## 2023-05-24 DIAGNOSIS — K219 Gastro-esophageal reflux disease without esophagitis: Secondary | ICD-10-CM

## 2023-05-24 MED ORDER — ETONOGESTREL-ETHINYL ESTRADIOL 0.12-0.015 MG/24HR VA RING: VAGINAL_RING | VAGINAL | 3 refills | Status: DC

## 2023-05-24 MED ORDER — HYDROXYZINE HCL 10 MG PO TABS: 10.0000 mg | ORAL_TABLET | Freq: Every evening | ORAL | 3 refills | Status: AC | PRN

## 2023-05-24 MED ORDER — ESOMEPRAZOLE MAGNESIUM 40 MG PO CPDR: 40.0000 mg | DELAYED_RELEASE_CAPSULE | Freq: Two times a day (BID) | ORAL | 3 refills | Status: DC

## 2023-05-24 NOTE — Patient Instructions (Addendum)
[facility]:   [address] [place], [place] [address] [phone]

## 2023-05-24 NOTE — Telephone Encounter (Signed)
Patient had last physical in 12/2022. Left a voicemail for patient and reschedule to 01/15/2024 at 1030am. Patient to call back if a issue with appt time or date.

## 2023-05-28 ENCOUNTER — Encounter: Admitting: Physician Assistant

## 2023-05-28 ENCOUNTER — Telehealth: Admitting: Physician Assistant

## 2023-05-28 DIAGNOSIS — B9689 Other specified bacterial agents as the cause of diseases classified elsewhere: Secondary | ICD-10-CM | POA: Diagnosis not present

## 2023-05-28 DIAGNOSIS — J208 Acute bronchitis due to other specified organisms: Secondary | ICD-10-CM | POA: Diagnosis not present

## 2023-05-28 MED ORDER — PROMETHAZINE-DM 6.25-15 MG/5ML PO SYRP: 5.0000 mL | ORAL_SOLUTION | Freq: Four times a day (QID) | ORAL | 0 refills | Status: DC | PRN

## 2023-05-28 MED ORDER — PREDNISONE 20 MG PO TABS: 40.0000 mg | ORAL_TABLET | Freq: Every day | ORAL | 0 refills | Status: DC

## 2023-05-28 MED ORDER — AZITHROMYCIN 250 MG PO TABS: ORAL_TABLET | ORAL | 0 refills | Status: AC

## 2023-05-28 NOTE — Progress Notes (Signed)
Patient cancelled EV

## 2023-05-28 NOTE — Progress Notes (Signed)
Virtual Visit Consent   Madison Vega, you are scheduled for a virtual visit with a [facility] provider today. Just as with appointments in the office, your consent must be obtained to participate. Your consent will be active for this visit and any virtual visit you may have with one of our providers in the next 365 days. If you have a MyChart account, a copy of this consent can be sent to you electronically.  As this is a virtual visit, video technology does not allow for your provider to perform a traditional examination. This may limit your provider's ability to fully assess your condition. If your provider identifies any concerns that need to be evaluated in person or the need to arrange testing (such as labs, EKG, etc.), we will make arrangements to do so. Although advances in technology are sophisticated, we cannot ensure that it will always work on either your end or our end. If the connection with a video visit is poor, the visit may have to be switched to a telephone visit. With either a video or telephone visit, we are not always able to ensure that we have a secure connection.  By engaging in this virtual visit, you consent to the provision of healthcare and authorize for your insurance to be billed (if applicable) for the services provided during this visit. Depending on your insurance coverage, you may receive a charge related to this service.  I need to obtain your verbal consent now. Are you willing to proceed with your visit today? Madison Vega has provided verbal consent on 05/28/2023 for a virtual visit (video or telephone). [name], PA-C  Date: 05/28/2023 6:16 PM  Virtual Visit via Video Note   I, [name], connected with  Madison Vega  ([id number], October 08, 2000) on 05/28/23 at  6:15 PM EDT by a video-enabled telemedicine application and verified that I am speaking with the correct person using two identifiers.  Location: Patient: Virtual Visit Location Patient:  Home Provider: Virtual Visit Location Provider: Home Office   I discussed the limitations of evaluation and management by telemedicine and the availability of in person appointments. The patient expressed understanding and agreed to proceed.    History of Present Illness: Madison Vega is a 22 y.o. who identifies as a female who was assigned female at birth, and is being seen today for cough.  HPI: Cough This is a new problem. The current episode started in the past 7 days (Had Covid 19 2 weeks ago; symptoms improved. Now with cough, congestion, wheezing.). The problem has been gradually worsening. The problem occurs constantly. The cough is Productive of sputum and productive of purulent sputum. Associated symptoms include chest pain, chills, a fever (yesterday), headaches, myalgias, postnasal drip and wheezing. Pertinent negatives include no ear congestion, ear pain, hemoptysis, nasal congestion, rhinorrhea or sore throat. The symptoms are aggravated by lying down. She has tried a beta-agonist inhaler and OTC cough suppressant for the symptoms. The treatment provided mild relief. Her past medical history is significant for asthma and bronchitis.   Did take Covid 19 at home test that was negative.    Problems:  Patient Active Problem List   Diagnosis Date Noted   GERD (gastroesophageal reflux disease) 10/05/2022   Depressive disorder 07/27/2022   Asthma 10/08/2006    Allergies:  Allergies  Allergen Reactions   Shellfish Allergy Nausea And Vomiting, Other (See Comments) and Itching   Duloxetine Hcl Anxiety   Medications:  Current Outpatient Medications:    azithromycin (  ZITHROMAX) 250 MG tablet, Take 2 tablets on day 1, then 1 tablet daily on days 2 through 5, Disp: 6 tablet, Rfl: 0   predniSONE (DELTASONE) 20 MG tablet, Take 2 tablets (40 mg total) by mouth daily with breakfast., Disp: 10 tablet, Rfl: 0   promethazine-dextromethorphan (PROMETHAZINE-DM) 6.25-15 MG/5ML syrup, Take 5 mLs by  mouth 4 (four) times daily as needed., Disp: 118 mL, Rfl: 0   albuterol (VENTOLIN HFA) 108 (90 Base) MCG/ACT inhaler, Inhale into the lungs every 6 (six) hours as needed for wheezing or shortness of breath., Disp: , Rfl:    esomeprazole (NEXIUM) 40 MG capsule, Take 1 capsule (40 mg total) by mouth 2 (two) times daily before a meal., Disp: 180 capsule, Rfl: 3   etonogestrel-ethinyl estradiol (NUVARING) 0.12-0.015 MG/24HR vaginal ring, Insert vaginally and leave in place for 3 consecutive weeks, then remove for 1 week., Disp: 3 each, Rfl: 3   hydrOXYzine (ATARAX) 10 MG tablet, Take 1 tablet (10 mg total) by mouth at bedtime as needed., Disp: 60 tablet, Rfl: 3   sertraline (ZOLOFT) 50 MG tablet, Take 50 mg by mouth daily., Disp: , Rfl:   Observations/Objective: Patient is well-developed, well-nourished in no acute distress.  Resting comfortably at home.  Head is normocephalic, atraumatic.  No labored breathing.  Speech is clear and coherent with logical content.  Patient is alert and oriented at baseline.    Assessment and Plan: 1. Acute bacterial bronchitis - azithromycin (ZITHROMAX) 250 MG tablet; Take 2 tablets on day 1, then 1 tablet daily on days 2 through 5  Dispense: 6 tablet; Refill: 0 - predniSONE (DELTASONE) 20 MG tablet; Take 2 tablets (40 mg total) by mouth daily with breakfast.  Dispense: 10 tablet; Refill: 0 - promethazine-dextromethorphan (PROMETHAZINE-DM) 6.25-15 MG/5ML syrup; Take 5 mLs by mouth 4 (four) times daily as needed.  Dispense: 118 mL; Refill: 0  - Worsening over a week despite OTC medications - Will treat with Z-pack, Prednisone and Promethazine DM - Can continue Mucinex  - Push fluids.  - Rest.  - Steam and humidifier can help - Seek in person evaluation if worsening or symptoms fail to improve    Follow Up Instructions: I discussed the assessment and treatment plan with the patient. The patient was provided an opportunity to ask questions and all were  answered. The patient agreed with the plan and demonstrated an understanding of the instructions.  A copy of instructions were sent to the patient via MyChart unless otherwise noted below.    The patient was advised to call back or seek an in-person evaluation if the symptoms worsen or if the condition fails to improve as anticipated.  Time:  I spent 10 minutes with the patient via telehealth technology discussing the above problems/concerns.    [name], PA-C

## 2023-05-28 NOTE — Patient Instructions (Signed)
Madison Vega, thank you for joining [name], PA-C for today's virtual visit.  While this provider is not your primary care provider (PCP), if your PCP is located in our provider database this encounter information will be shared with them immediately following your visit.   A [facility] MyChart account gives you access to today's visit and all your visits, tests, and labs performed at [facility] " click here if you don't have a [facility] MyChart account or go to [email or web address]  Consent: (Patient) Madison Vega provided verbal consent for this virtual visit at the beginning of the encounter.  Current Medications:  Current Outpatient Medications:    azithromycin (ZITHROMAX) 250 MG tablet, Take 2 tablets on day 1, then 1 tablet daily on days 2 through 5, Disp: 6 tablet, Rfl: 0   predniSONE (DELTASONE) 20 MG tablet, Take 2 tablets (40 mg total) by mouth daily with breakfast., Disp: 10 tablet, Rfl: 0   promethazine-dextromethorphan (PROMETHAZINE-DM) 6.25-15 MG/5ML syrup, Take 5 mLs by mouth 4 (four) times daily as needed., Disp: 118 mL, Rfl: 0   albuterol (VENTOLIN HFA) 108 (90 Base) MCG/ACT inhaler, Inhale into the lungs every 6 (six) hours as needed for wheezing or shortness of breath., Disp: , Rfl:    esomeprazole (NEXIUM) 40 MG capsule, Take 1 capsule (40 mg total) by mouth 2 (two) times daily before a meal., Disp: 180 capsule, Rfl: 3   etonogestrel-ethinyl estradiol (NUVARING) 0.12-0.015 MG/24HR vaginal ring, Insert vaginally and leave in place for 3 consecutive weeks, then remove for 1 week., Disp: 3 each, Rfl: 3   hydrOXYzine (ATARAX) 10 MG tablet, Take 1 tablet (10 mg total) by mouth at bedtime as needed., Disp: 60 tablet, Rfl: 3   sertraline (ZOLOFT) 50 MG tablet, Take 50 mg by mouth daily., Disp: , Rfl:    Medications ordered in this encounter:  Meds ordered this encounter  Medications   azithromycin (ZITHROMAX) 250 MG tablet    Sig: Take 2 tablets on  day 1, then 1 tablet daily on days 2 through 5    Dispense:  6 tablet    Refill:  0    Order Specific Question:   Supervising Provider    Answer:   Merrilee Jansky [[id number]]   predniSONE (DELTASONE) 20 MG tablet    Sig: Take 2 tablets (40 mg total) by mouth daily with breakfast.    Dispense:  10 tablet    Refill:  0    Order Specific Question:   Supervising Provider    Answer:   Merrilee Jansky [[id number]]   promethazine-dextromethorphan (PROMETHAZINE-DM) 6.25-15 MG/5ML syrup    Sig: Take 5 mLs by mouth 4 (four) times daily as needed.    Dispense:  118 mL    Refill:  0    Order Specific Question:   Supervising Provider    Answer:   Merrilee Jansky [[id number]]     *If you need refills on other medications prior to your next appointment, please contact your pharmacy*  Follow-Up: Call back or seek an in-person evaluation if the symptoms worsen or if the condition fails to improve as anticipated.  [facility] [phone]  Other Instructions Acute Bronchitis, Adult  Acute bronchitis is sudden inflammation of the main airways (bronchi) that come off the windpipe (trachea) in the lungs. The swelling causes the airways to get smaller and make more mucus than normal. This can make it hard to breathe and can cause coughing or noisy breathing (wheezing).  Acute bronchitis may last several weeks. The cough may last longer. Allergies, asthma, and exposure to smoke may make the condition worse. What are the causes? This condition can be caused by germs and by substances that irritate the lungs, including: Cold and flu viruses. The most common cause of this condition is the virus that causes the common cold. Bacteria. This is less common. Breathing in substances that irritate the lungs, including: Smoke from cigarettes and other forms of tobacco. Dust and pollen. Fumes from household cleaning products, gases, or burned fuel. Indoor or outdoor air pollution. What increases  the risk? The following factors may make you more likely to develop this condition: A weak body's defense system, also called the immune system. A condition that affects your lungs and breathing, such as asthma. What are the signs or symptoms? Common symptoms of this condition include: Coughing. This may bring up clear, yellow, or green mucus from your lungs (sputum). Wheezing. Runny or stuffy nose. Having too much mucus in your lungs (chest congestion). Shortness of breath. Aches and pains, including sore throat or chest. How is this diagnosed? This condition is usually diagnosed based on: Your symptoms and medical history. A physical exam. You may also have other tests, including tests to rule out other conditions, such as pneumonia. These tests include: A test of lung function. Test of a mucus sample to look for the presence of bacteria. Tests to check the oxygen level in your blood. Blood tests. Chest X-ray. How is this treated? Most cases of acute bronchitis clear up over time without treatment. Your health care provider may recommend: Drinking more fluids to help thin your mucus so it is easier to cough up. Taking inhaled medicine (inhaler) to improve air flow in and out of your lungs. Using a vaporizer or a humidifier. These are machines that add water to the air to help you breathe better. Taking a medicine that thins mucus and clears congestion (expectorant). Taking a medicine that prevents or stops coughing (cough suppressant). It is not common to take an antibiotic medicine for this condition. Follow these instructions at home:  Take over-the-counter and prescription medicines only as told by your health care provider. Use an inhaler, vaporizer, or humidifier as told by your health care provider. Take two teaspoons (10 mL) of honey at bedtime to lessen coughing at night. Drink enough fluid to keep your urine pale yellow. Do not use any products that contain nicotine or  tobacco. These products include cigarettes, chewing tobacco, and vaping devices, such as e-cigarettes. If you need help quitting, ask your health care provider. Get plenty of rest. Return to your normal activities as told by your health care provider. Ask your health care provider what activities are safe for you. Keep all follow-up visits. This is important. How is this prevented? To lower your risk of getting this condition again: Wash your hands often with soap and water for at least 20 seconds. If soap and water are not available, use hand sanitizer. Avoid contact with people who have cold symptoms. Try not to touch your mouth, nose, or eyes with your hands. Avoid breathing in smoke or chemical fumes. Breathing smoke or chemical fumes will make your condition worse. Get the flu shot every year. Contact a health care provider if: Your symptoms do not improve after 2 weeks. You have trouble coughing up the mucus. Your cough keeps you awake at night. You have a fever. Get help right away if you: Cough up blood. Feel  pain in your chest. Have severe shortness of breath. Faint or keep feeling like you are going to faint. Have a severe headache. Have a fever or chills that get worse. These symptoms may represent a serious problem that is an emergency. Do not wait to see if the symptoms will go away. Get medical help right away. Call your local emergency services (911 in the [place] Do not drive yourself to the hospital. Summary Acute bronchitis is inflammation of the main airways (bronchi) that come off the windpipe (trachea) in the lungs. The swelling causes the airways to get smaller and make more mucus than normal. Drinking more fluids can help thin your mucus so it is easier to cough up. Take over-the-counter and prescription medicines only as told by your health care provider. Do not use any products that contain nicotine or tobacco. These products include cigarettes, chewing tobacco,  and vaping devices, such as e-cigarettes. If you need help quitting, ask your health care provider. Contact a health care provider if your symptoms do not improve after 2 weeks. This information is not intended to replace advice given to you by your health care provider. Make sure you discuss any questions you have with your health care provider. Document Revised: 12/15/2021 Document Reviewed: 01/05/2021 Elsevier Patient Education  2024 Elsevier Inc.    If you have been instructed to have an in-person evaluation today at a local Urgent Care facility, please use the link below. It will take you to a list of all of our available [facility], including address, phone number and hours of operation. Please do not delay care.  [facility]  If you or a family member do not have a primary care provider, use the link below to schedule a visit and establish care. When you choose a [facility] primary care physician or advanced practice provider, you gain a long-term partner in health. Find a Primary Care Provider  Learn more about [facility] in-office and virtual care options: [facility] - Get Care Now

## 2023-07-14 ENCOUNTER — Other Ambulatory Visit (HOSPITAL_BASED_OUTPATIENT_CLINIC_OR_DEPARTMENT_OTHER): Payer: Self-pay | Admitting: Family Medicine

## 2023-07-16 ENCOUNTER — Other Ambulatory Visit (HOSPITAL_BASED_OUTPATIENT_CLINIC_OR_DEPARTMENT_OTHER): Payer: Self-pay

## 2023-07-16 ENCOUNTER — Institutional Professional Consult (permissible substitution) (INDEPENDENT_AMBULATORY_CARE_PROVIDER_SITE_OTHER): Payer: Self-pay | Admitting: Otolaryngology

## 2023-07-16 MED ORDER — VENTOLIN HFA 108 (90 BASE) MCG/ACT IN AERS
1.0000 | INHALATION_SPRAY | Freq: Four times a day (QID) | RESPIRATORY_TRACT | 0 refills | Status: AC | PRN
  Filled ????-??-??: fill #0

## 2023-07-18 ENCOUNTER — Ambulatory Visit: Payer: Self-pay

## 2023-07-25 ENCOUNTER — Encounter (HOSPITAL_BASED_OUTPATIENT_CLINIC_OR_DEPARTMENT_OTHER): Admitting: Family Medicine

## 2023-07-31 ENCOUNTER — Telehealth: Payer: Self-pay | Admitting: Physician Assistant

## 2023-07-31 DIAGNOSIS — A084 Viral intestinal infection, unspecified: Secondary | ICD-10-CM

## 2023-07-31 NOTE — Progress Notes (Signed)
E-Visit for Diarrhea  We are sorry that you are not feeling well.  Here is how we plan to help!  Based on what you have shared with me it looks like you have Acute Infectious Diarrhea.  Most cases of acute diarrhea are due to infections with virus and bacteria and are self-limited conditions lasting less than 14 days.  For your symptoms you may take Imodium 2 mg tablets that are over the counter at your local pharmacy. Take two tablet now and then one after each loose stool up to 6 a day.  Antibiotics are not needed for most people with diarrhea.   HOME CARE We recommend changing your diet to help with your symptoms for the next few days. Drink plenty of fluids that contain water salt and sugar. Sports drinks such as Gatorade may help.  You may try broths, soups, bananas, applesauce, soft breads, mashed potatoes or crackers.  You are considered infectious for as long as the diarrhea continues. Hand washing or use of alcohol based hand sanitizers is recommend. It is best to stay out of work or school until your symptoms stop.   GET HELP RIGHT AWAY If you have dark yellow colored urine or do not pass urine frequently you should drink more fluids.   If your symptoms worsen  If you feel like you are going to pass out (faint) You have a new problem  MAKE SURE YOU  Understand these instructions. Will watch your condition. Will get help right away if you are not doing well or get worse.  Thank you for choosing an e-visit.  Your e-visit answers were reviewed by a board certified advanced clinical practitioner to complete your personal care plan. Depending upon the condition, your plan could have included both over the counter or prescription medications.  Please review your pharmacy choice. Make sure the pharmacy is open so you can pick up prescription now. If there is a problem, you may contact your provider through [employer] and have the prescription routed to another pharmacy.   Your safety is important to [place]. If you have drug allergies check your prescription carefully.   For the next 24 hours you can use MyChart to ask questions about today's visit, request a non-urgent call back, or ask for a work or school excuse. You will get an email in the next two days asking about your experience. I hope that your e-visit has been valuable and will speed your recovery.

## 2023-07-31 NOTE — Progress Notes (Signed)
I have spent 5 minutes in review of e-visit questionnaire, review and updating patient chart, medical decision making and response to patient.   Mia Milan Cody Jacklynn Dehaas, PA-C    

## 2023-08-13 ENCOUNTER — Encounter (HOSPITAL_BASED_OUTPATIENT_CLINIC_OR_DEPARTMENT_OTHER): Admitting: Family Medicine

## 2023-09-14 ENCOUNTER — Telehealth: Payer: Self-pay | Admitting: Physician Assistant

## 2023-09-14 DIAGNOSIS — J4541 Moderate persistent asthma with (acute) exacerbation: Secondary | ICD-10-CM

## 2023-09-14 MED ORDER — ALBUTEROL SULFATE (2.5 MG/3ML) 0.083% IN NEBU: 2.5000 mg | INHALATION_SOLUTION | Freq: Four times a day (QID) | RESPIRATORY_TRACT | 0 refills | Status: DC | PRN

## 2023-09-14 NOTE — Progress Notes (Signed)
E-Visit for Asthma  Based on what you have shared with me, it looks like you may have a flare up of your asthma.  Asthma is a chronic (ongoing) lung disease which results in airway obstruction, inflammation and hyper-responsiveness.   Asthma symptoms vary from person to person, with common symptoms including nighttime awakening and decreased ability to participate in normal activities as a result of shortness of breath. It is often triggered by changes in weather, changes in the season, changes in air temperature, or inside (home, school, daycare or work) allergens such as animal dander, mold, mildew, woodstoves or cockroaches.   It can also be triggered by hormonal changes, extreme emotion, physical exertion or an upper respiratory tract illness.     It is important to identify the trigger, and then eliminate or avoid the trigger if possible.   If you have been prescribed medications to be taken on a regular basis, it is important to follow the asthma action plan and to follow guidelines to adjust medication in response to increasing symptoms of decreased peak expiratory flow rate  Treatment: I have prescribed: Albuterol (Proventil) (2.5 mg in 3 mL) 0.083 % Take by nebulization solution every six hours as needed for wheezing or shortness of breath  HOME CARE Only take medications as instructed by your medical team. Consider wearing a mask or scarf to improve breathing air temperature have been shown to decrease irritation and decrease exacerbations Get rest. Taking a steamy shower or using a humidifier may help nasal congestion sand ease sore throat pain. You can place a towel over your head and breathe in the steam from hot water coming from a faucet. Using a saline nasal spray works much the same way.  Cough drops, hare candies and sore throat lozenges may ease your cough.   Avoid close contacts especially the very you and the elderly Cover your mouth if you cough or sneeze Always remember to wash your hands.    GET HELP RIGHT AWAY IF: You develop worsening symptoms; breathlessness at rest, drowsy, confused or agitated, unable to speak in full sentences You have coughing fits You develop a severe headache or visual changes You develop shortness of breath, difficulty breathing or start having chest pain Your symptoms persist after you have completed your treatment plan If your symptoms do not improve within 10 days  MAKE SURE YOU Understand these instructions. Will watch your condition. Will get help right away if you are not doing well or get worse.   Your e-visit answers were reviewed by a board certified advanced clinical practitioner to complete your personal care plan, Depending upon the condition, your plan could have included both over the counter or prescription medications.   Please review your pharmacy choice. Your safety is important to [place]. If you have drug allergies check your prescription carefully.  You can use MyChart to ask questions about today's visit, request a non-urgent  call back, or ask for a work or school excuse for 24 hours related to this e-Visit. If it has been greater than 24 hours you will need to follow up with your provider, or enter a new e-Visit to address those concerns.   You will get an e-mail in the next two days asking about your experience. I hope that your e-visit has been valuable and will speed your recovery. Thank you for using e-visits.   I have spent 5 minutes in review of e-visit questionnaire, review and updating patient chart, medical decision making and response  to patient.   [name], PA-C

## 2023-09-20 ENCOUNTER — Other Ambulatory Visit: Payer: Self-pay

## 2023-09-20 ENCOUNTER — Encounter (HOSPITAL_COMMUNITY): Payer: Self-pay

## 2023-09-20 ENCOUNTER — Emergency Department (HOSPITAL_COMMUNITY)
Admission: EM | Admit: 2023-09-20 | Discharge: 2023-09-21 | Disposition: A | Discharge status: Home / Self Care | Payer: Self-pay | Attending: Emergency Medicine | Admitting: Emergency Medicine

## 2023-09-20 DIAGNOSIS — R1031 Right lower quadrant pain: Secondary | ICD-10-CM | POA: Insufficient documentation

## 2023-09-20 DIAGNOSIS — R112 Nausea with vomiting, unspecified: Secondary | ICD-10-CM | POA: Insufficient documentation

## 2023-09-20 DIAGNOSIS — R197 Diarrhea, unspecified: Secondary | ICD-10-CM | POA: Insufficient documentation

## 2023-09-20 DIAGNOSIS — D72829 Elevated white blood cell count, unspecified: Secondary | ICD-10-CM | POA: Insufficient documentation

## 2023-09-20 HISTORY — DX: Noninfective gastroenteritis and colitis, unspecified: K52.9

## 2023-09-20 HISTORY — DX: Gastro-esophageal reflux disease without esophagitis: K21.9

## 2023-09-20 LAB — COMPREHENSIVE METABOLIC PANEL
ALT: 71 U/L — ABNORMAL HIGH (ref 0–44)
AST: 31 U/L (ref 15–41)
Albumin: 4.1 g/dL (ref 3.5–5.0)
Alkaline Phosphatase: 103 U/L (ref 38–126)
Anion gap: 11 (ref 5–15)
BUN: 13 mg/dL (ref 6–20)
CO2: 24 mmol/L (ref 22–32)
Calcium: 9.4 mg/dL (ref 8.9–10.3)
Chloride: 103 mmol/L (ref 98–111)
Creatinine, Ser: 0.71 mg/dL (ref 0.44–1.00)
GFR, Estimated: >60 mL/min (ref >60)
Glucose, Bld: 111 mg/dL — ABNORMAL HIGH (ref 70–99)
Potassium: 3.9 mmol/L (ref 3.5–5.1)
Sodium: 138 mmol/L (ref 135–145)
Total Bilirubin: 0.6 mg/dL (ref 0.0–1.2)
Total Protein: 7.6 g/dL (ref 6.5–8.1)

## 2023-09-20 LAB — CBC
HCT: 42.1 % (ref 36.0–46.0)
Hemoglobin: 13.7 g/dL (ref 12.0–15.0)
MCH: 26.7 pg (ref 26.0–34.0)
MCHC: 32.5 g/dL (ref 30.0–36.0)
MCV: 82.1 fL (ref 80.0–100.0)
Platelets: 355 10*3/uL (ref 150–400)
RBC: 5.13 MIL/uL — ABNORMAL HIGH (ref 3.87–5.11)
RDW: 12.9 % (ref 11.5–15.5)
WBC: 13.3 10*3/uL — ABNORMAL HIGH (ref 4.0–10.5)
nRBC: 0 % (ref 0.0–0.2)

## 2023-09-20 LAB — LIPASE, BLOOD: Lipase: 23 U/L (ref 11–51)

## 2023-09-20 NOTE — ED Triage Notes (Signed)
 Pt arrived POV for abd pain x2 days with n/v/d. Pain to RLQ, non radiating, denies urinary issues. VSS, NAD noted, A&O x4. Hx of gastroenteritis and GERD.

## 2023-09-21 ENCOUNTER — Emergency Department (HOSPITAL_COMMUNITY): Payer: Self-pay

## 2023-09-21 LAB — URINALYSIS, ROUTINE W REFLEX MICROSCOPIC
Bacteria, UA: NONE SEEN
Bilirubin Urine: NEGATIVE
Glucose, UA: NEGATIVE mg/dL
Hgb urine dipstick: NEGATIVE
Ketones, ur: 5 mg/dL — AB
Leukocytes,Ua: NEGATIVE
Nitrite: NEGATIVE
Protein, ur: 100 mg/dL — AB
Specific Gravity, Urine: 1.024 (ref 1.005–1.030)
pH: 6 (ref 5.0–8.0)

## 2023-09-21 LAB — POC URINE PREG, ED: Preg Test, Ur: NEGATIVE

## 2023-09-21 MED ORDER — IOHEXOL 300 MG/ML  SOLN
100.0000 mL | Freq: Once | INTRAMUSCULAR | Status: AC | PRN
  Administered 2023-09-21: 100 mL via INTRAVENOUS

## 2023-09-21 MED ORDER — ONDANSETRON 4 MG PO TBDP: ORAL_TABLET | ORAL | 0 refills | Status: DC

## 2023-09-21 MED ORDER — MORPHINE SULFATE (PF) 4 MG/ML IV SOLN
4.0000 mg | Freq: Once | INTRAVENOUS | Status: AC
  Administered 2023-09-21: 4 mg via INTRAVENOUS
  Filled 2023-09-21: qty 1

## 2023-09-21 MED ORDER — DICYCLOMINE HCL 20 MG PO TABS
20.0000 mg | ORAL_TABLET | Freq: Three times a day (TID) | ORAL | 0 refills | Status: DC | PRN
Start: 2023-09-21 — End: 2023-10-05

## 2023-09-21 MED ORDER — LOPERAMIDE HCL 2 MG PO CAPS
4.0000 mg | ORAL_CAPSULE | Freq: Once | ORAL | Status: AC
  Administered 2023-09-21: 4 mg via ORAL
  Filled 2023-09-21: qty 2

## 2023-09-21 MED ORDER — LOPERAMIDE HCL 2 MG PO CAPS
4.0000 mg | ORAL_CAPSULE | Freq: Four times a day (QID) | ORAL | 0 refills | Status: DC | PRN
Start: 2023-09-21 — End: 2023-10-05

## 2023-09-21 MED ORDER — ONDANSETRON HCL 4 MG/2ML IJ SOLN
4.0000 mg | Freq: Once | INTRAMUSCULAR | Status: AC
  Administered 2023-09-21: 4 mg via INTRAVENOUS
  Filled 2023-09-21: qty 2

## 2023-09-21 NOTE — ED Provider Notes (Signed)
 [facility] EMERGENCY DEPARTMENT AT [facility] Provider Note   CSN: [id number] Arrival date & time: 09/20/23  2219     History  Chief Complaint  Patient presents with   Abdominal Pain    Madison Vega is a 23 y.o. female.  Presents to the emergency department for evaluation of abdominal pain.  Patient reports that she has been nauseated for 1 day.  At 8 PM tonight she started having sharp pains in the right lower abdomen, nausea, vomiting, diarrhea.       Home Medications Prior to Admission medications   Medication Sig Start Date End Date Taking? Authorizing Provider  dicyclomine  (BENTYL ) 20 MG tablet Take 1 tablet (20 mg total) by mouth 3 (three) times daily as needed for spasms. 09/21/23  Yes Zaria Taha, Lonni PARAS, MD  loperamide  (IMODIUM ) 2 MG capsule Take 2 capsules (4 mg total) by mouth 4 (four) times daily as needed for diarrhea or loose stools. 09/21/23  Yes Everly Rubalcava, Lonni PARAS, MD  ondansetron  (ZOFRAN -ODT) 4 MG disintegrating tablet 4mg  ODT q4 hours prn nausea/vomit 09/21/23  Yes Teyana Pierron, Lonni PARAS, MD  albuterol  (PROVENTIL ) (2.5 MG/3ML) 0.083% nebulizer solution Take 3 mLs (2.5 mg total) by nebulization every 6 (six) hours as needed for wheezing or shortness of breath. 09/14/23   Vivienne Delon HERO, PA-C  VENTOLIN  HFA 108 (90 Base) MCG/ACT inhaler Inhale 1 puff into the lungs every 6 (six) hours as needed for wheezing or shortness of breath. 07/16/23   Caudle, Thersia Bitters, FNP  esomeprazole  (NEXIUM ) 40 MG capsule Take 1 capsule (40 mg total) by mouth 2 (two) times daily before a meal. 05/24/23   Caudle, Thersia Bitters, FNP  etonogestrel -ethinyl estradiol  (NUVARING) 0.12-0.015 MG/24HR vaginal ring Insert vaginally and leave in place for 3 consecutive weeks, then remove for 1 week. 05/24/23   Caudle, Thersia Bitters, FNP  hydrOXYzine  (ATARAX ) 10 MG tablet Take 1 tablet (10 mg total) by mouth at bedtime as needed. 05/24/23   Caudle, Thersia Bitters, FNP  sertraline  (ZOLOFT )  50 MG tablet Take 50 mg by mouth daily.    [provider]      Allergies    Shellfish allergy and Duloxetine hcl    Review of Systems   Review of Systems  Physical Exam Updated Vital Signs BP 131/87   Pulse 93   Temp 98.8 F (37.1 C) (Oral)   Resp 15   Ht 4' 11 (1.499 m)   Wt 68 kg   LMP 09/12/2023 (Exact Date)   SpO2 99%   BMI 30.30 kg/m  Physical Exam Vitals and nursing note reviewed.  Constitutional:      General: She is not in acute distress.    Appearance: She is well-developed.  HENT:     Head: Normocephalic and atraumatic.     Mouth/Throat:     Mouth: Mucous membranes are moist.  Eyes:     General: Vision grossly intact. Gaze aligned appropriately.     Extraocular Movements: Extraocular movements intact.     Conjunctiva/sclera: Conjunctivae normal.  Cardiovascular:     Rate and Rhythm: Normal rate and regular rhythm.     Pulses: Normal pulses.     Heart sounds: Normal heart sounds, S1 normal and S2 normal. No murmur heard.    No friction rub. No gallop.  Pulmonary:     Effort: Pulmonary effort is normal. No respiratory distress.     Breath sounds: Normal breath sounds.  Abdominal:     General: Bowel sounds are normal.  Palpations: Abdomen is soft.     Tenderness: There is abdominal tenderness in the right lower quadrant. There is no guarding or rebound.     Hernia: No hernia is present.  Musculoskeletal:        General: No swelling.     Cervical back: Full passive range of motion without pain, normal range of motion and neck supple. No spinous process tenderness or muscular tenderness. Normal range of motion.     Right lower leg: No edema.     Left lower leg: No edema.  Skin:    General: Skin is warm and dry.     Capillary Refill: Capillary refill takes less than 2 seconds.     Findings: No ecchymosis, erythema, rash or wound.  Neurological:     General: No focal deficit present.     Mental Status: She is alert and oriented to person,  place, and time.     GCS: GCS eye subscore is 4. GCS verbal subscore is 5. GCS motor subscore is 6.     Cranial Nerves: Cranial nerves 2-12 are intact.     Sensory: Sensation is intact.     Motor: Motor function is intact.     Coordination: Coordination is intact.  Psychiatric:        Attention and Perception: Attention normal.        Mood and Affect: Mood normal.        Speech: Speech normal.        Behavior: Behavior normal.     ED Results / Procedures / Treatments   Labs (all labs ordered are listed, but only abnormal results are displayed) Labs Reviewed  COMPREHENSIVE METABOLIC PANEL - Abnormal; Notable for the following components:      Result Value   Glucose, Bld 111 (*)    ALT 71 (*)    All other components within normal limits  CBC - Abnormal; Notable for the following components:   WBC 13.3 (*)    RBC 5.13 (*)    All other components within normal limits  URINALYSIS, ROUTINE W REFLEX MICROSCOPIC - Abnormal; Notable for the following components:   Ketones, ur 5 (*)    Protein, ur 100 (*)    All other components within normal limits  LIPASE, BLOOD  POC URINE PREG, ED    EKG None  Radiology CT ABDOMEN PELVIS W CONTRAST Result Date: 09/21/2023 CLINICAL DATA:  Right lower quadrant abdominal pain. EXAM: CT ABDOMEN AND PELVIS WITH CONTRAST TECHNIQUE: Multidetector CT imaging of the abdomen and pelvis was performed using the standard protocol following bolus administration of intravenous contrast. RADIATION DOSE REDUCTION: This exam was performed according to the departmental dose-optimization program which includes automated exposure control, adjustment of the mA and/or kV according to patient size and/or use of iterative reconstruction technique. CONTRAST:  OMNIPAQUE  IOHEXOL  300 MG/ML  SOLN COMPARISON:  09/08/2022 FINDINGS: Lower chest: Clear lung bases. Hepatobiliary: No focal liver abnormality is seen. No gallstones, gallbladder wall thickening, or biliary  dilatation. Pancreas: Unremarkable. No pancreatic ductal dilatation or surrounding inflammatory changes. Spleen: Normal in size without focal abnormality. Adrenals/Urinary Tract: No adrenal nodules. No hydronephrosis or renal calculi. Left renal cyst. No further follow-up imaging is recommended. The urinary bladder is nondistended. Stomach/Bowel: Patulous distal esophagus. The stomach is nondistended. Scattered fluid-filled loops of small bowel in the pelvis without wall thickening. No obstruction or inflammation. Fluid/liquid stool in the ascending and transverse colon. The distal colon is nondistended. Normal appendix. Vascular/Lymphatic: Normal caliber abdominal aorta.  Patent portal vein. No adenopathy. Reproductive: Uterus and bilateral adnexa are unremarkable. Vaginal ring in place. Other: No free air, free fluid, or intra-abdominal fluid collection. Musculoskeletal: There are no acute or suspicious osseous abnormalities. IMPRESSION: Fluid/liquid stool in the ascending and transverse colon, can be seen with diarrheal illness. No bowel obstruction or inflammation. Electronically Signed   By: Andrea Gasman M.D.   On: 09/21/2023 02:48    Procedures Procedures    Medications Ordered in ED Medications  loperamide  (IMODIUM ) capsule 4 mg (has no administration in time range)  morphine  (PF) 4 MG/ML injection 4 mg (4 mg Intravenous Given 09/21/23 0137)  ondansetron  (ZOFRAN ) injection 4 mg (4 mg Intravenous Given 09/21/23 0136)  iohexol  (OMNIPAQUE ) 300 MG/ML solution 100 mL (100 mLs Intravenous Contrast Given 09/21/23 0216)    ED Course/ Medical Decision Making/ A&P                                 Medical Decision Making Amount and/or Complexity of Data Reviewed Labs: ordered. Radiology: ordered.  Risk Prescription drug management.   Differential Diagnosis considered includes, but not limited to: Appendicitis; colitis; diverticulitis; bowel obstruction; cystitis; nephrolithiasis; pyelonephritis;  ovarian cyst, ovarian torsion, PID, ectopic pregnancy.   Presents with right abdominal pain.  She has had nausea, vomiting and diarrhea associated with the pain.  Patient does have an appendix.  She is tender in the right lower quadrant but no signs of peritonitis.  Lab work with mild leukocytosis, otherwise no abnormality.  She is not pregnant.  Patient underwent CT scan that shows diarrheal illness, no other acute findings.        Final Clinical Impression(s) / ED Diagnoses Final diagnoses:  Nausea vomiting and diarrhea    Rx / DC Orders ED Discharge Orders          Ordered    ondansetron  (ZOFRAN -ODT) 4 MG disintegrating tablet        09/21/23 0312    dicyclomine  (BENTYL ) 20 MG tablet  3 times daily PRN        09/21/23 0312    loperamide  (IMODIUM ) 2 MG capsule  4 times daily PRN        09/21/23 0312              Daniele Dillow J, MD 09/21/23 [phone]

## 2023-09-26 ENCOUNTER — Encounter (HOSPITAL_BASED_OUTPATIENT_CLINIC_OR_DEPARTMENT_OTHER): Payer: Self-pay

## 2023-09-26 ENCOUNTER — Encounter (HOSPITAL_BASED_OUTPATIENT_CLINIC_OR_DEPARTMENT_OTHER): Payer: Self-pay | Admitting: Family Medicine

## 2023-10-05 ENCOUNTER — Encounter: Payer: Self-pay | Admitting: Gastroenterology

## 2023-10-05 ENCOUNTER — Ambulatory Visit (INDEPENDENT_AMBULATORY_CARE_PROVIDER_SITE_OTHER): Payer: Self-pay | Admitting: Gastroenterology

## 2023-10-05 ENCOUNTER — Ambulatory Visit (INDEPENDENT_AMBULATORY_CARE_PROVIDER_SITE_OTHER)
Admission: RE | Admit: 2023-10-05 | Discharge: 2023-10-05 | Disposition: A | Discharge status: Home / Self Care | Payer: Self-pay | Source: Ambulatory Visit | Attending: Gastroenterology

## 2023-10-05 VITALS — BP 112/74 | HR 76 | Ht 59.0 in | Wt 162.1 lb

## 2023-10-05 DIAGNOSIS — R112 Nausea with vomiting, unspecified: Secondary | ICD-10-CM

## 2023-10-05 DIAGNOSIS — R198 Other specified symptoms and signs involving the digestive system and abdomen: Secondary | ICD-10-CM

## 2023-10-05 DIAGNOSIS — K219 Gastro-esophageal reflux disease without esophagitis: Secondary | ICD-10-CM

## 2023-10-05 DIAGNOSIS — R109 Unspecified abdominal pain: Secondary | ICD-10-CM

## 2023-10-05 DIAGNOSIS — R110 Nausea: Secondary | ICD-10-CM

## 2023-10-05 DIAGNOSIS — K582 Mixed irritable bowel syndrome: Secondary | ICD-10-CM

## 2023-10-05 DIAGNOSIS — R1010 Upper abdominal pain, unspecified: Secondary | ICD-10-CM

## 2023-10-05 NOTE — Progress Notes (Signed)
Chief Complaint: Alternating diarrhea/constipation.  Nausea Primary GI MD: Dr. Chales Abrahams  HPI: 23 year old female with medical history as listed below presents for evaluation of  Last seen by Alcide Evener, NP General 2024 and at that time she was having GERD ongoing for 6 months with some improvement on esomeprazole 40 Mg daily.  Also had intermittent nonbloody diarrhea since July 2023. Celiac negative.  Thyroid normal EGD 11/01/2022 with Dr. Chales Abrahams for GERD - Small hiatal hernia - Otherwise normal EGD - Negative biopsies  Recently seen in the ED 09/20/2023 for abdominal pain in her right lower abdomen, nausea, vomiting, diarrhea Workup showed leukocytosis with WBC 13.3 ALT 71  CT abdomen pelvis with contrast showed normal gallbladder.  Unremarkable pancreas.  Patulous distal esophagus.  Scattered fluid-filled loops of small bowel in the pelvis without wall thickening.  Fluid/liquid stool in the ascending and transverse colon.  Consistent with diarrheal illness.  No stool studies were complete Patient was discharged on Bentyl, Zofran, and Imodium  -------------TODAY-----------------  The patient, with a history of gastroesophageal reflux disease (GERD), presents with worsening symptoms despite medication. She reports that after an initial improvement following an endoscopy and initiation of famotidine, she experienced bleeding and subsequently discontinued the medication. Around September or October, she noticed a recurrence of acid reflux symptoms and developed new onset nausea with most meals. She also reports intermittent regurgitation of food, although this is less frequent.  Currently on esomeprazole 40 Mg once daily  In addition to these symptoms, the patient has been experiencing unpredictable bowel habits. She describes episodes of diarrhea occurring approximately twice a week, interspersed with periods of normal bowel movements or constipation. She reports no abdominal pain,  but occasionally experiences a sensation of pressure in the upper abdomen.     PREVIOUS GI WORKUP   EGD 11/01/2022 with Dr. Chales Abrahams for GERD - Small hiatal hernia - Otherwise normal EGD - Negative biopsies  1. Surgical [P], duodenum - DUODENAL MUCOSA WITHOUT DIAGNOSTIC ABNORMALITY - NEGATIVE FOR CELIAC CHANGE 2. Surgical [P], gastric - OXYNTIC MUCOSA WITHOUT DIAGNOSTIC ABNORMALITY - NEGATIVE FOR HELICOBACTER ORGANISMS ON H&E STAIN 3. Surgical [P], proximal esophagus, mid esophagus and distal esophagus - SQUAMOUS MUCOSA WITH REACTIVE CHANGES CONSISTENT WITH REFLUX - NEGATIVE FOR EOSINOPHILS - NEGATIVE FOR INTESTINAL METAPLASIA  Past Medical History:  Diagnosis Date   Anxiety    Asthma    Depression    Gastroenteritis    GERD (gastroesophageal reflux disease)     History reviewed. No pertinent surgical history.  Current Outpatient Medications  Medication Sig Dispense Refill   albuterol (PROVENTIL) (2.5 MG/3ML) 0.083% nebulizer solution Take 3 mLs (2.5 mg total) by nebulization every 6 (six) hours as needed for wheezing or shortness of breath. 150 mL 0   esomeprazole (NEXIUM) 40 MG capsule Take 1 capsule (40 mg total) by mouth 2 (two) times daily before a meal. 180 capsule 3   etonogestrel-ethinyl estradiol (NUVARING) 0.12-0.015 MG/24HR vaginal ring Insert vaginally and leave in place for 3 consecutive weeks, then remove for 1 week. 3 each 3   hydrOXYzine (ATARAX) 10 MG tablet Take 1 tablet (10 mg total) by mouth at bedtime as needed. 60 tablet 3   sertraline (ZOLOFT) 50 MG tablet Take 50 mg by mouth daily.     VENTOLIN HFA 108 (90 Base) MCG/ACT inhaler Inhale 1 puff into the lungs every 6 (six) hours as needed for wheezing or shortness of breath. 1 each 0   No current facility-administered medications for this visit.  Allergies as of 10/05/2023 - Review Complete 10/05/2023  Allergen Reaction Noted   Shellfish allergy Nausea And Vomiting, Other (See Comments), and Itching  02/06/2016   Duloxetine hcl Anxiety 09/07/2017    Family History  Problem Relation Age of Onset   Breast cancer Paternal Grandmother    Colon cancer Paternal Great-grandmother    Stomach cancer Neg Hx    Esophageal cancer Neg Hx    Rectal cancer Neg Hx     Social History   Socioeconomic History   Marital status: Single    Spouse name: Not on file   Number of children: 0   Years of education: Not on file   Highest education level: Some college, no degree  Occupational History   Occupation: [employer]  Tobacco Use   Smoking status: Never   Smokeless tobacco: Never  Vaping Use   Vaping status: Former  Substance and Sexual Activity   Alcohol use: Yes    Comment: Occ   Drug use: Never   Sexual activity: Yes    Comment: nuvaring  Other Topics Concern   Not on file  Social History Narrative   Not on file   Social Drivers of Health   Financial Resource Strain: Low Risk  (09/26/2023)   Overall Financial Resource Strain (CARDIA)    Difficulty of Paying Living Expenses: Not very hard  Food Insecurity: No Food Insecurity (09/26/2023)   Hunger Vital Sign    Worried About Running Out of Food in the Last Year: Never true    Ran Out of Food in the Last Year: Never true  Transportation Needs: No Transportation Needs (09/26/2023)   PRAPARE - Administrator, Civil Service (Medical): No    Lack of Transportation (Non-Medical): No  Physical Activity: Insufficiently Active (09/26/2023)   Exercise Vital Sign    Days of Exercise per Week: 3 days    Minutes of Exercise per Session: 40 min  Stress: No Stress Concern Present (09/26/2023)   [employer] of Occupational Health - Occupational Stress Questionnaire    Feeling of Stress : Only a little  Social Connections: Socially Isolated (09/26/2023)   Social Connection and Isolation Panel [NHANES]    Frequency of Communication with Friends and Family: More than three times a week    Frequency of Social Gatherings with Friends and  Family: More than three times a week    Attends Religious Services: Never    Database administrator or Organizations: No    Attends Engineer, structural: Not on file    Marital Status: Never married  Intimate Partner Violence: Not At Risk (12/15/2022)   Received from [employer], [facility]   HITS    Over the last 12 months how often did your partner physically hurt you?: Never    Over the last 12 months how often did your partner insult you or talk down to you?: Never    Over the last 12 months how often did your partner threaten you with physical harm?: Never    Over the last 12 months how often did your partner scream or curse at you?: Never    Review of Systems:    Constitutional: No weight loss, fever, chills, weakness or fatigue HEENT: Eyes: No change in vision               Ears, Nose, Throat:  No change in hearing or congestion Skin: No rash or itching Cardiovascular: No chest pain, chest pressure or palpitations  Respiratory: No SOB or cough Gastrointestinal: See HPI and otherwise negative Genitourinary: No dysuria or change in urinary frequency Neurological: No headache, dizziness or syncope Musculoskeletal: No new muscle or joint pain Hematologic: No bleeding or bruising Psychiatric: No history of depression or anxiety    Physical Exam:  Vital signs: BP 112/74 (BP Location: Left Arm, Patient Position: Sitting, Cuff Size: Normal)   Pulse 76   Ht 4\' 11"  (1.499 m)   Wt 162 lb 2 oz (73.5 kg)   LMP 09/12/2023 (Exact Date)   SpO2 98%   BMI 32.75 kg/m   Constitutional: NAD, Well developed, Well nourished, alert and cooperative Head:  Normocephalic and atraumatic. Eyes:   PEERL, EOMI. No icterus. Conjunctiva pink. Respiratory: Respirations even and unlabored. Lungs clear to auscultation bilaterally.   No wheezes, crackles, or rhonchi.  Cardiovascular:  Regular rate and rhythm. No peripheral edema, cyanosis or pallor.  Gastrointestinal: Soft, slightly  distended, slow bowel sounds.  Nontender Rectal:  Not performed.  Msk:  Symmetrical without gross deformities. Without edema, no deformity or joint abnormality.  Neurologic:  Alert and  oriented x4;  grossly normal neurologically.  Skin:   Dry and intact without significant lesions or rashes. Psychiatric: Oriented to person, place and time. Demonstrates good judgement and reason without abnormal affect or behaviors.   RELEVANT LABS AND IMAGING: CBC    Component Value Date/Time   WBC 13.3 (H) 09/20/2023 2253   RBC 5.13 (H) 09/20/2023 2253   HGB 13.7 09/20/2023 2253   HCT 42.1 09/20/2023 2253   PLT 355 09/20/2023 2253   MCV 82.1 09/20/2023 2253   MCH 26.7 09/20/2023 2253   MCHC 32.5 09/20/2023 2253   RDW 12.9 09/20/2023 2253    CMP     Component Value Date/Time   NA 138 09/20/2023 2253   K 3.9 09/20/2023 2253   CL 103 09/20/2023 2253   CO2 24 09/20/2023 2253   GLUCOSE 111 (H) 09/20/2023 2253   BUN 13 09/20/2023 2253   CREATININE 0.71 09/20/2023 2253   CALCIUM 9.4 09/20/2023 2253   PROT 7.6 09/20/2023 2253   ALBUMIN 4.1 09/20/2023 2253   AST 31 09/20/2023 2253   ALT 71 (H) 09/20/2023 2253   ALKPHOS 103 09/20/2023 2253   BILITOT 0.6 09/20/2023 2253   GFRNONAA >60 09/20/2023 2253     Assessment/Plan:      Gastroesophageal Reflux Disease (GERD) Breakthrough symptoms on esomeprazole 40 Mg once daily.  EGD last year overall unrevealing with negative biopsies.  Nausea with meals and intermittent regurgitation. --Increase esomeprazole to 40mg  twice daily, 30 minutes before breakfast and dinner. - Educated patient on lifestyle modifications  Irritable Bowel Syndrome (IBS) Alternating diarrhea and constipation, with approximately two episodes of diarrhea per week. Occasional upper abdominal pain and sensation of pressure. No lower abdominal pain currently. Possible distention noted on physical exam along with slow bowel sounds.  Consider IBS mixed type versus constipation  with overflow. --Start fiber supplement (Metamucil) 1-2 times daily. --Order abdominal x-ray to assess for constipation. --Provide samples of IB gard for abdominal pain.  Follow-up in 8-12 weeks or sooner if symptoms do not improve. Patient to provide update in 1 month     Infectious diarrhea History of infectious diarrhea early January which has since resolved  Boone Master, PA-C [facility] 10/05/2023, 10:22 AM  Cc: Hilbert Bible, *

## 2023-10-05 NOTE — Patient Instructions (Addendum)
_______________________________________________________  If your blood pressure at your visit was 140/90 or greater, please contact your primary care physician to follow up on this.  _______________________________________________________  If you are age 23 or older, your body mass index should be between 23-30. Your Body mass index is 32.75 kg/m. If this is out of the aforementioned range listed, please consider follow up with your Primary Care Provider.  If you are age 93 or younger, your body mass index should be between 19-25. Your Body mass index is 32.75 kg/m. If this is out of the aformentioned range listed, please consider follow up with your Primary Care Provider.   ________________________________________________________  The [facility] providers would like to encourage you to use [facility] to communicate with providers for non-urgent requests or questions.  Due to long hold times on the telephone, sending your provider a message by [facility] may be a faster and more efficient way to get a response.  Please allow 48 business hours for a response.  Please remember that this is for non-urgent requests.  _______________________________________________________  Your provider has requested that you have an abdominal x ray before leaving today. Please go to the basement floor to our Radiology department for the test.  Please purchase the following medications over the counter and take as directed: Fiber  We have given you samples of the following medication to take: IBgard  Please take nexium 1 tablet 2 times a day.  You have been scheduled for an appointment with Dr. Boone Master PA-C on 12-17-23 at 10:20. Please arrive 10 minutes early for your appointment.  It was a pleasure to see you today!  Thank you for trusting me with your gastrointestinal care!

## 2023-10-14 ENCOUNTER — Telehealth: Payer: Self-pay | Admitting: Family

## 2023-10-14 DIAGNOSIS — J209 Acute bronchitis, unspecified: Secondary | ICD-10-CM

## 2023-10-14 MED ORDER — PREDNISONE 10 MG (21) PO TBPK: ORAL_TABLET | ORAL | 0 refills | Status: DC

## 2023-10-14 MED ORDER — BENZONATATE 100 MG PO CAPS: 100.0000 mg | ORAL_CAPSULE | Freq: Three times a day (TID) | ORAL | 0 refills | Status: DC | PRN

## 2023-10-14 NOTE — Addendum Note (Signed)
Addended by: Jannifer Rodney A on: 10/14/2023 02:58 PM   Modules accepted: Orders

## 2023-10-14 NOTE — Progress Notes (Signed)
E-Visit for Cough   We are sorry that you are not feeling well.  Here is how we plan to help!  Based on your presentation I believe you most likely have A cough due to a virus.  This is called viral bronchitis and is best treated by rest, plenty of fluids and control of the cough.  You may use Ibuprofen or Tylenol as directed to help your symptoms.     In addition you may use A non-prescription cough medication called Robitussin DAC. Take 2 teaspoons every 8 hours or Delsym: take 2 teaspoons every 12 hours., A non-prescription cough medication called Mucinex DM: take 2 tablets every 12 hours., and A prescription cough medication called Tessalon Perles 100mg . You may take 1-2 capsules every 8 hours as needed for your cough.  Prednisone 10 mg daily for 6 days (see taper instructions below)  Directions for 6 day taper: Day 1: 2 tablets before breakfast, 1 after both lunch & dinner and 2 at bedtime Day 2: 1 tab before breakfast, 1 after both lunch & dinner and 2 at bedtime Day 3: 1 tab at each meal & 1 at bedtime Day 4: 1 tab at breakfast, 1 at lunch, 1 at bedtime Day 5: 1 tab at breakfast & 1 tab at bedtime Day 6: 1 tab at breakfast  From your responses in the eVisit questionnaire you describe inflammation in the upper respiratory tract which is causing a significant cough.  This is commonly called Bronchitis and has four common causes:   Allergies Viral Infections Acid Reflux Bacterial Infection Allergies, viruses and acid reflux are treated by controlling symptoms or eliminating the cause. An example might be a cough caused by taking certain blood pressure medications. You stop the cough by changing the medication. Another example might be a cough caused by acid reflux. Controlling the reflux helps control the cough.  USE OF BRONCHODILATOR ("RESCUE") INHALERS: There is a risk from using your bronchodilator too frequently.  The risk is that over-reliance on a medication which only relaxes  the muscles surrounding the breathing tubes can reduce the effectiveness of medications prescribed to reduce swelling and congestion of the tubes themselves.  Although you feel brief relief from the bronchodilator inhaler, your asthma may actually be worsening with the tubes becoming more swollen and filled with mucus.  This can delay other crucial treatments, such as oral steroid medications. If you need to use a bronchodilator inhaler daily, several times per day, you should discuss this with your provider.  There are probably better treatments that could be used to keep your asthma under control.     HOME CARE Only take medications as instructed by your medical team. Complete the entire course of an antibiotic. Drink plenty of fluids and get plenty of rest. Avoid close contacts especially the very young and the elderly Cover your mouth if you cough or cough into your sleeve. Always remember to wash your hands A steam or ultrasonic humidifier can help congestion.   GET HELP RIGHT AWAY IF: You develop worsening fever. You become short of breath You cough up blood. Your symptoms persist after you have completed your treatment plan MAKE SURE YOU  Understand these instructions. Will watch your condition. Will get help right away if you are not doing well or get worse.    Thank you for choosing an e-visit.  Your e-visit answers were reviewed by a board certified advanced clinical practitioner to complete your personal care plan. Depending upon the condition, your  plan could have included both over the counter or prescription medications.  Please review your pharmacy choice. Make sure the pharmacy is open so you can pick up prescription now. If there is a problem, you may contact your provider through [employer] and have the prescription routed to another pharmacy.  Your safety is important to [place]. If you have drug allergies check your prescription carefully.   For the next 24 hours you  can use MyChart to ask questions about today's visit, request a non-urgent call back, or ask for a work or school excuse. You will get an email in the next two days asking about your experience. I hope that your e-visit has been valuable and will speed your recovery.  Approximately 5 minutes was spent documenting and reviewing patient's chart.

## 2023-10-14 NOTE — Addendum Note (Signed)
Addended by: Jannifer Rodney A on: 10/14/2023 01:34 PM   Modules accepted: Orders

## 2023-10-19 NOTE — Progress Notes (Signed)
 Agree with assessment/plan.  Edman Circle, MD [facility] [phone]

## 2023-10-21 ENCOUNTER — Encounter (HOSPITAL_BASED_OUTPATIENT_CLINIC_OR_DEPARTMENT_OTHER): Payer: Self-pay | Admitting: Family Medicine

## 2023-10-22 ENCOUNTER — Other Ambulatory Visit (HOSPITAL_BASED_OUTPATIENT_CLINIC_OR_DEPARTMENT_OTHER): Payer: Self-pay | Admitting: Family Medicine

## 2023-10-22 DIAGNOSIS — K219 Gastro-esophageal reflux disease without esophagitis: Secondary | ICD-10-CM

## 2023-10-22 DIAGNOSIS — F411 Generalized anxiety disorder: Secondary | ICD-10-CM | POA: Insufficient documentation

## 2023-10-22 MED ORDER — SERTRALINE HCL 50 MG PO TABS: 50.0000 mg | ORAL_TABLET | Freq: Every day | ORAL | 5 refills | Status: DC

## 2023-10-22 MED ORDER — ESOMEPRAZOLE MAGNESIUM 40 MG PO CPDR: 40.0000 mg | DELAYED_RELEASE_CAPSULE | Freq: Two times a day (BID) | ORAL | 3 refills | Status: AC

## 2023-10-22 NOTE — Telephone Encounter (Signed)
 Jon Gills, please see mychart sent by pt and advise.

## 2023-10-23 ENCOUNTER — Other Ambulatory Visit (HOSPITAL_BASED_OUTPATIENT_CLINIC_OR_DEPARTMENT_OTHER): Payer: Self-pay | Admitting: Family Medicine

## 2023-10-23 ENCOUNTER — Encounter (HOSPITAL_BASED_OUTPATIENT_CLINIC_OR_DEPARTMENT_OTHER): Payer: Self-pay | Admitting: *Deleted

## 2023-10-23 ENCOUNTER — Encounter (HOSPITAL_BASED_OUTPATIENT_CLINIC_OR_DEPARTMENT_OTHER): Payer: Self-pay | Admitting: Family Medicine

## 2023-10-23 DIAGNOSIS — Z3044 Encounter for surveillance of vaginal ring hormonal contraceptive device: Secondary | ICD-10-CM

## 2023-10-23 MED ORDER — ETONOGESTREL-ETHINYL ESTRADIOL 0.12-0.015 MG/24HR VA RING: VAGINAL_RING | VAGINAL | 3 refills | Status: DC

## 2023-11-14 ENCOUNTER — Encounter (HOSPITAL_BASED_OUTPATIENT_CLINIC_OR_DEPARTMENT_OTHER): Payer: Self-pay | Admitting: Family Medicine

## 2023-11-14 ENCOUNTER — Ambulatory Visit (INDEPENDENT_AMBULATORY_CARE_PROVIDER_SITE_OTHER): Payer: Self-pay | Admitting: Family Medicine

## 2023-11-14 VITALS — BP 125/75 | HR 75 | Ht 59.0 in | Wt 165.2 lb

## 2023-11-14 DIAGNOSIS — E669 Obesity, unspecified: Secondary | ICD-10-CM

## 2023-11-14 DIAGNOSIS — N946 Dysmenorrhea, unspecified: Secondary | ICD-10-CM

## 2023-11-14 NOTE — Patient Instructions (Signed)
R

## 2023-11-14 NOTE — Progress Notes (Signed)
 Subjective:   Madison Vega [birth year] 11/14/2023  Chief Complaint  Patient presents with   Medical Management of Chronic Issues    Patient states she has been trying to lose weight but instead of losing it, she has been gaining weight.    HPI: Madison Vega presents today for re-assessment and management of chronic medical conditions.  WEIGHT MANAGEMENT: [name] presents for weight management.   Current dietary plan: Increasing protein, decrease fatty foods , portions and is going out less to eat.  Regular exercise regimen: exercising 3-4x a week and running at least once week.  Medications tried in the past: None Patient reports she has a goal weight of 135lbs.   Denies symptoms of hypothyroid disorder, prediabetes.    Wt Readings from Last 3 Encounters:  11/14/23 165 lb 3.2 oz (74.9 kg)  10/05/23 162 lb 2 oz (73.5 kg)  09/20/23 150 lb (68 kg)     ABDOMINAL PAIN: Onset: 2-3 months  Location:  RLQ and right suprapubic area  Description of pain: Patient reports RLQ pain ongoing for approx. 3 months that worsens at the time of her menstrual cycle. Patient was seen in the ER for RLQ pain and had workup for possible appendicitis. Labs show ALT elevation 71. Her CT was unremarkable.   Radiation: None Severity: Moderate Treatment tried: None   Fever: no Nausea: no Vomiting: no Weight loss: no Decreased appetite: no Diarrhea: no Constipation: no Blood in stool: no Heartburn: no Dysuria/urinary frequency: no Hematuria: no History of sexually transmitted disease: no Recurrent NSAID use: no    The following portions of the patient's history were reviewed and updated as appropriate: past medical history, past surgical history, family history, social history, allergies, medications, and problem list.   Patient Active Problem List   Diagnosis Date Noted   GAD (generalized anxiety disorder) 10/22/2023   GERD (gastroesophageal reflux disease) 10/05/2022    Depressive disorder 07/27/2022   Asthma 10/08/2006   Past Medical History:  Diagnosis Date   Anxiety    Asthma    Depression    Gastroenteritis    GERD (gastroesophageal reflux disease)    History reviewed. No pertinent surgical history. Family History  Problem Relation Age of Onset   Breast cancer Paternal Grandmother    Colon cancer Paternal Great-grandmother    Stomach cancer Neg Hx    Esophageal cancer Neg Hx    Rectal cancer Neg Hx    Outpatient Medications Prior to Visit  Medication Sig Dispense Refill   albuterol (PROVENTIL) (2.5 MG/3ML) 0.083% nebulizer solution Take 3 mLs (2.5 mg total) by nebulization every 6 (six) hours as needed for wheezing or shortness of breath. 150 mL 0   benzonatate (TESSALON PERLES) 100 MG capsule Take 1 capsule (100 mg total) by mouth 3 (three) times daily as needed. 20 capsule 0   esomeprazole (NEXIUM) 40 MG capsule Take 1 capsule (40 mg total) by mouth 2 (two) times daily before a meal. 180 capsule 3   etonogestrel-ethinyl estradiol (NUVARING) 0.12-0.015 MG/24HR vaginal ring Insert vaginally and leave in place for 3 consecutive weeks, then remove for 1 week. 9 each 3   hydrOXYzine (ATARAX) 10 MG tablet Take 1 tablet (10 mg total) by mouth at bedtime as needed. 60 tablet 3   sertraline (ZOLOFT) 50 MG tablet Take 1 tablet (50 mg total) by mouth daily. 30 tablet 5   VENTOLIN HFA 108 (90 Base) MCG/ACT inhaler Inhale 1 puff into the lungs every 6 (six) hours as  needed for wheezing or shortness of breath. 1 each 0   predniSONE (STERAPRED UNI-PAK 21 TAB) 10 MG (21) TBPK tablet Use as directed 21 tablet 0   No facility-administered medications prior to visit.   Allergies  Allergen Reactions   Shellfish Allergy Nausea And Vomiting, Other (See Comments) and Itching   Duloxetine Hcl Anxiety     ROS: A complete ROS was performed with pertinent positives/negatives noted in the HPI. The remainder of the ROS are negative.    Objective:   Today's  Vitals   11/14/23 1512  BP: 125/75  Pulse: 75  SpO2: 100%  Weight: 165 lb 3.2 oz (74.9 kg)  Height: 4\' 11"  (1.499 m)    Physical Exam          GENERAL: Well-appearing, in NAD. Well nourished.  SKIN: Pink, warm and dry. No rash, lesion, ulceration, or ecchymoses.  Head: Normocephalic. NECK: Trachea midline. Full ROM w/o pain or tenderness.  THROAT: Uvula midline. Oropharynx clear. Mucous membranes pink and moist.  RESPIRATORY: Chest wall symmetrical. Respirations even and non-labored. Breath sounds clear to auscultation bilaterally.  CARDIAC: S1, S2 present, regular rate and rhythm without murmur or gallops. Peripheral pulses 2+ bilaterally.  GI: Abdomen soft, non-tender. Normoactive bowel sounds. Mild tenderness present to right lower pelvic area with palpation.  No hepatomegaly or splenomegaly. No CVA tenderness.  MSK: Muscle tone and strength appropriate for age. NEUROLOGIC: No motor or sensory deficits. Steady, even gait. C2-C12 intact.  PSYCH/MENTAL STATUS: Alert, oriented x 3. Cooperative, appropriate mood and affect.      Assessment & Plan:  1. Obesity (BMI 30-39.9) (Primary) Discussed possible comorbidities contributing to weight gain and will rule out with fasting labs. Discussed good dietary regimen for weight loss and exercise to help improve weight loss. Recommend [facility] pending labs if needed.  - TSH Rfx on Abnormal to Free T4 - Hemoglobin A1c - Comprehensive metabolic panel  2. Painful menstrual periods Possible cyst or fibroid occurring given pain occurring at time of menses. Will complete pelvic US to rule out possible cyst formation. No signs of acute abdomen, appendicitis on exam. Will repeat LFT's with labs today given recent ALT elevation.  - US Pelvic Complete With Transvaginal; Future   No orders of the defined types were placed in this encounter.  Lab Orders         TSH Rfx on Abnormal to Free T4         Hemoglobin A1c          Comprehensive metabolic panel      Return in about 3 months (around 02/11/2024) for ANNUAL PHYSICAL.    Patient to reach out to office if new, worrisome, or unresolved symptoms arise or if no improvement in patient's condition. Patient verbalized understanding and is agreeable to treatment plan. All questions answered to patient's satisfaction.    Hilbert Bible, [place]

## 2023-11-15 ENCOUNTER — Other Ambulatory Visit (HOSPITAL_BASED_OUTPATIENT_CLINIC_OR_DEPARTMENT_OTHER): Payer: Self-pay | Admitting: Family Medicine

## 2023-11-15 ENCOUNTER — Encounter (HOSPITAL_BASED_OUTPATIENT_CLINIC_OR_DEPARTMENT_OTHER): Payer: Self-pay | Admitting: Family Medicine

## 2023-11-15 DIAGNOSIS — E669 Obesity, unspecified: Secondary | ICD-10-CM

## 2023-11-15 LAB — COMPREHENSIVE METABOLIC PANEL
ALT: 27 IU/L (ref 0–32)
AST: 26 IU/L (ref 0–40)
Albumin: 4.2 g/dL (ref 4.0–5.0)
Alkaline Phosphatase: 82 IU/L (ref 44–121)
BUN/Creatinine Ratio: 18 (ref 9–23)
BUN: 14 mg/dL (ref 6–20)
Bilirubin Total: 0.3 mg/dL (ref 0.0–1.2)
CO2: 20 mmol/L (ref 20–29)
Calcium: 9.5 mg/dL (ref 8.7–10.2)
Chloride: 104 mmol/L (ref 96–106)
Creatinine, Ser: 0.76 mg/dL (ref 0.57–1.00)
Globulin, Total: 2.7 g/dL (ref 1.5–4.5)
Glucose: 93 mg/dL (ref 70–99)
Potassium: 4.5 mmol/L (ref 3.5–5.2)
Sodium: 139 mmol/L (ref 134–144)
Total Protein: 6.9 g/dL (ref 6.0–8.5)
eGFR: 114 mL/min/{1.73_m2} (ref >59)

## 2023-11-15 LAB — TSH RFX ON ABNORMAL TO FREE T4: TSH: 2.76 u[IU]/mL (ref 0.450–4.500)

## 2023-11-15 LAB — HEMOGLOBIN A1C
Est. average glucose Bld gHb Est-mCnc: 108 mg/dL
Hgb A1c MFr Bld: 5.4 % (ref 4.8–5.6)

## 2023-11-15 NOTE — Progress Notes (Signed)
 Hi Madison Vega, There are no signs of thyroid disease, prediabetes, diabetes or fatty liver disease that could be contributing to your weight gain.  If you are interested in the referral to [facility] please let me know and I be happy to place this for you.

## 2023-11-15 NOTE — Telephone Encounter (Signed)
 Patient said she would like to have the referral to healthy weight and wellness.

## 2023-11-16 ENCOUNTER — Ambulatory Visit (HOSPITAL_BASED_OUTPATIENT_CLINIC_OR_DEPARTMENT_OTHER)
Admission: RE | Admit: 2023-11-16 | Discharge: 2023-11-16 | Disposition: A | Discharge status: Home / Self Care | Payer: Self-pay | Source: Ambulatory Visit | Attending: Family Medicine | Admitting: Family Medicine

## 2023-11-16 DIAGNOSIS — N946 Dysmenorrhea, unspecified: Secondary | ICD-10-CM | POA: Insufficient documentation

## 2023-11-20 ENCOUNTER — Encounter (HOSPITAL_BASED_OUTPATIENT_CLINIC_OR_DEPARTMENT_OTHER): Payer: Self-pay | Admitting: Family Medicine

## 2023-11-21 ENCOUNTER — Encounter (HOSPITAL_BASED_OUTPATIENT_CLINIC_OR_DEPARTMENT_OTHER): Payer: Self-pay | Admitting: Family Medicine

## 2023-11-21 NOTE — Progress Notes (Signed)
 Hi Madison Vega,  Your ultrasound is negative for any appearance of cyst or fibroids that could be contributing to your painful menstrual cycles.  Using a NSAIDs such as ibuprofen 600 mg to 800 mg 3 times a day with meals 3 to 5 days prior to your period can sometimes help to decrease the heaviness of the uterine lining and decrease inflammation that can cause painful cramping.  If pain is continuing with trying this, please let me know and we could discuss other options for birth control to help.

## 2023-11-22 ENCOUNTER — Encounter (INDEPENDENT_AMBULATORY_CARE_PROVIDER_SITE_OTHER): Payer: Self-pay

## 2023-12-13 ENCOUNTER — Encounter (HOSPITAL_BASED_OUTPATIENT_CLINIC_OR_DEPARTMENT_OTHER): Payer: Self-pay | Admitting: Emergency Medicine

## 2023-12-13 ENCOUNTER — Emergency Department (HOSPITAL_BASED_OUTPATIENT_CLINIC_OR_DEPARTMENT_OTHER)
Admission: EM | Admit: 2023-12-13 | Discharge: 2023-12-13 | Disposition: A | Discharge status: Home / Self Care | Attending: Emergency Medicine | Admitting: Emergency Medicine

## 2023-12-13 ENCOUNTER — Other Ambulatory Visit: Payer: Self-pay

## 2023-12-13 ENCOUNTER — Emergency Department (HOSPITAL_BASED_OUTPATIENT_CLINIC_OR_DEPARTMENT_OTHER): Admitting: Radiology

## 2023-12-13 DIAGNOSIS — J45909 Unspecified asthma, uncomplicated: Secondary | ICD-10-CM | POA: Diagnosis not present

## 2023-12-13 DIAGNOSIS — Y92410 Unspecified street and highway as the place of occurrence of the external cause: Secondary | ICD-10-CM | POA: Insufficient documentation

## 2023-12-13 DIAGNOSIS — M25512 Pain in left shoulder: Secondary | ICD-10-CM | POA: Insufficient documentation

## 2023-12-13 DIAGNOSIS — Z7951 Long term (current) use of inhaled steroids: Secondary | ICD-10-CM | POA: Diagnosis not present

## 2023-12-13 DIAGNOSIS — G8911 Acute pain due to trauma: Secondary | ICD-10-CM | POA: Insufficient documentation

## 2023-12-13 NOTE — ED Notes (Addendum)
 Discharge instructions reviewed.   Opportunity for questions and concerns provided.   Alert, oriented and ambulatory.   Displays no signs of distress.   Arm sling provided for home use.
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
## 2023-12-14 ENCOUNTER — Encounter (HOSPITAL_BASED_OUTPATIENT_CLINIC_OR_DEPARTMENT_OTHER): Payer: Self-pay | Admitting: Student

## 2023-12-14 ENCOUNTER — Ambulatory Visit (HOSPITAL_BASED_OUTPATIENT_CLINIC_OR_DEPARTMENT_OTHER): Payer: Self-pay | Admitting: Student

## 2023-12-14 ENCOUNTER — Other Ambulatory Visit (HOSPITAL_BASED_OUTPATIENT_CLINIC_OR_DEPARTMENT_OTHER): Payer: Self-pay

## 2023-12-14 ENCOUNTER — Ambulatory Visit (HOSPITAL_BASED_OUTPATIENT_CLINIC_OR_DEPARTMENT_OTHER): Payer: Self-pay

## 2023-12-14 DIAGNOSIS — M542 Cervicalgia: Secondary | ICD-10-CM

## 2023-12-14 DIAGNOSIS — M25512 Pain in left shoulder: Secondary | ICD-10-CM

## 2023-12-14 MED ORDER — METHOCARBAMOL 500 MG PO TABS
500.0000 mg | ORAL_TABLET | Freq: Four times a day (QID) | ORAL | 0 refills | Status: AC
  Filled 2023-12-14: qty 40, 10d supply, fill #0
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
## 2023-12-17 ENCOUNTER — Ambulatory Visit (INDEPENDENT_AMBULATORY_CARE_PROVIDER_SITE_OTHER): Payer: Self-pay | Admitting: Gastroenterology

## 2023-12-17 ENCOUNTER — Encounter: Payer: Self-pay | Admitting: Gastroenterology

## 2023-12-17 VITALS — BP 110/64 | HR 85 | Ht 59.0 in | Wt 161.0 lb

## 2023-12-17 DIAGNOSIS — R198 Other specified symptoms and signs involving the digestive system and abdomen: Secondary | ICD-10-CM

## 2023-12-17 DIAGNOSIS — K582 Mixed irritable bowel syndrome: Secondary | ICD-10-CM

## 2023-12-17 DIAGNOSIS — R1013 Epigastric pain: Secondary | ICD-10-CM

## 2023-12-17 DIAGNOSIS — K219 Gastro-esophageal reflux disease without esophagitis: Secondary | ICD-10-CM

## 2023-12-17 MED ORDER — FAMOTIDINE 40 MG PO TABS: 40.0000 mg | ORAL_TABLET | Freq: Every day | ORAL | 2 refills | Status: DC

## 2023-12-17 NOTE — Patient Instructions (Signed)
 We have sent the following medications to your pharmacy for you to pick up at your convenience:  Famotidine 40 mg daily  _______________________________________________________  If your blood pressure at your visit was 140/90 or greater, please contact your primary care physician to follow up on this.  _______________________________________________________  If you are age 23 or older, your body mass index should be between 23-30. Your Body mass index is 32.52 kg/m. If this is out of the aforementioned range listed, please consider follow up with your Primary Care Provider.  If you are age 34 or younger, your body mass index should be between 19-25. Your Body mass index is 32.52 kg/m. If this is out of the aformentioned range listed, please consider follow up with your Primary Care Provider.   ________________________________________________________  The [facility] providers would like to encourage you to use [facility] to communicate with providers for non-urgent requests or questions.  Due to long hold times on the telephone, sending your provider a message by [facility] may be a faster and more efficient way to get a response.  Please allow 48 business hours for a response.  Please remember that this is for non-urgent requests.  _______________________________________________________

## 2023-12-17 NOTE — Progress Notes (Signed)
 Chief Complaint: Follow-up Primary GI MD: Dr. Chales Abrahams  HPI: 23 year old female presents for follow-up  Last seen January 2025.  See note for details. Last visit patient was having breakthrough GERD on esomeprazole 40 Mg once daily.  EGD February 2024 with small hiatal hernia otherwise unrevealing.  We increased her esomeprazole to twice daily.  She also had a CT scan January 2025 for diarrhea which showed a patulous distal esophagus.  Her symptoms of IBS alternating diarrhea and constipation with KUB confirming constipation have improved with fiber and IBgard.  Today she states on esomeprazole twice daily her symptoms are improved but she still has occasional breakthrough symptoms 2-3 times per week no matter what she eats.   PREVIOUS GI WORKUP    CT abdomen pelvis with contrast showed normal gallbladder. Unremarkable pancreas. Patulous distal esophagus. Scattered fluid-filled loops of small bowel in the pelvis without wall thickening. Fluid/liquid stool in the ascending and transverse colon. Consistent with diarrheal illness.   EGD 11/01/2022 with Dr. Chales Abrahams for GERD - Small hiatal hernia - Otherwise normal EGD - Negative biopsies   1. Surgical [P], duodenum - DUODENAL MUCOSA WITHOUT DIAGNOSTIC ABNORMALITY - NEGATIVE FOR CELIAC CHANGE 2. Surgical [P], gastric - OXYNTIC MUCOSA WITHOUT DIAGNOSTIC ABNORMALITY - NEGATIVE FOR HELICOBACTER ORGANISMS ON H&E STAIN 3. Surgical [P], proximal esophagus, mid esophagus and distal esophagus - SQUAMOUS MUCOSA WITH REACTIVE CHANGES CONSISTENT WITH REFLUX - NEGATIVE FOR EOSINOPHILS - NEGATIVE FOR INTESTINAL METAPLASIA  Past Medical History:  Diagnosis Date   Anxiety    Asthma    Depression    Gastroenteritis    GERD (gastroesophageal reflux disease)     No past surgical history on file.  Current Outpatient Medications  Medication Sig Dispense Refill   albuterol (PROVENTIL) (2.5 MG/3ML) 0.083% nebulizer solution Take 3 mLs (2.5 mg  total) by nebulization every 6 (six) hours as needed for wheezing or shortness of breath. 150 mL 0   benzonatate (TESSALON PERLES) 100 MG capsule Take 1 capsule (100 mg total) by mouth 3 (three) times daily as needed. 20 capsule 0   esomeprazole (NEXIUM) 40 MG capsule Take 1 capsule (40 mg total) by mouth 2 (two) times daily before a meal. 180 capsule 3   etonogestrel-ethinyl estradiol (NUVARING) 0.12-0.015 MG/24HR vaginal ring Insert vaginally and leave in place for 3 consecutive weeks, then remove for 1 week. 9 each 3   hydrOXYzine (ATARAX) 10 MG tablet Take 1 tablet (10 mg total) by mouth at bedtime as needed. 60 tablet 3   methocarbamol (ROBAXIN) 500 MG tablet Take 1 tablet (500 mg total) by mouth 4 (four) times daily for 10 days. 40 tablet 0   sertraline (ZOLOFT) 50 MG tablet Take 1 tablet (50 mg total) by mouth daily. 30 tablet 5   VENTOLIN HFA 108 (90 Base) MCG/ACT inhaler Inhale 1 puff into the lungs every 6 (six) hours as needed for wheezing or shortness of breath. 1 each 0   No current facility-administered medications for this visit.    Allergies as of 12/17/2023 - Review Complete 12/14/2023  Allergen Reaction Noted   Shellfish allergy Nausea And Vomiting, Other (See Comments), and Itching 02/06/2016   Duloxetine hcl Anxiety 09/07/2017    Family History  Problem Relation Age of Onset   Breast cancer Paternal Grandmother    Colon cancer Paternal Great-grandmother    Stomach cancer Neg Hx    Esophageal cancer Neg Hx    Rectal cancer Neg Hx     Social History   Socioeconomic  History   Marital status: Single    Spouse name: Not on file   Number of children: 0   Years of education: Not on file   Highest education level: Some college, no degree  Occupational History   Occupation: [employer]  Tobacco Use   Smoking status: Never   Smokeless tobacco: Never  Vaping Use   Vaping status: Former  Substance and Sexual Activity   Alcohol use: Yes    Comment: Occ   Drug use:  Never   Sexual activity: Yes    Comment: nuvaring  Other Topics Concern   Not on file  Social History Narrative   Not on file   Social Drivers of Health   Financial Resource Strain: Low Risk  (09/26/2023)   Overall Financial Resource Strain (CARDIA)    Difficulty of Paying Living Expenses: Not very hard  Food Insecurity: No Food Insecurity (09/26/2023)   Hunger Vital Sign    Worried About Running Out of Food in the Last Year: Never true    Ran Out of Food in the Last Year: Never true  Transportation Needs: No Transportation Needs (09/26/2023)   PRAPARE - Administrator, Civil Service (Medical): No    Lack of Transportation (Non-Medical): No  Physical Activity: Insufficiently Active (09/26/2023)   Exercise Vital Sign    Days of Exercise per Week: 3 days    Minutes of Exercise per Session: 40 min  Stress: No Stress Concern Present (09/26/2023)   [employer] of Occupational Health - Occupational Stress Questionnaire    Feeling of Stress : Only a little  Social Connections: Socially Isolated (09/26/2023)   Social Connection and Isolation Panel [NHANES]    Frequency of Communication with Friends and Family: More than three times a week    Frequency of Social Gatherings with Friends and Family: More than three times a week    Attends Religious Services: Never    Database administrator or Organizations: No    Attends Engineer, structural: Not on file    Marital Status: Never married  Intimate Partner Violence: Not At Risk (12/15/2022)   Received from [employer], [facility]   HITS    Over the last 12 months how often did your partner physically hurt you?: Never    Over the last 12 months how often did your partner insult you or talk down to you?: Never    Over the last 12 months how often did your partner threaten you with physical harm?: Never    Over the last 12 months how often did your partner scream or curse at you?: Never    Review of Systems:     Constitutional: No weight loss, fever, chills, weakness or fatigue HEENT: Eyes: No change in vision               Ears, Nose, Throat:  No change in hearing or congestion Skin: No rash or itching Cardiovascular: No chest pain, chest pressure or palpitations   Respiratory: No SOB or cough Gastrointestinal: See HPI and otherwise negative Genitourinary: No dysuria or change in urinary frequency Neurological: No headache, dizziness or syncope Musculoskeletal: No new muscle or joint pain Hematologic: No bleeding or bruising Psychiatric: No history of depression or anxiety    Physical Exam:  Vital signs: LMP 12/06/2023 (Approximate)   Constitutional: NAD, Well developed, Well nourished, alert and cooperative Head:  Normocephalic and atraumatic. Eyes:   PEERL, EOMI. No icterus. Conjunctiva pink. Respiratory: Respirations even and  unlabored. Lungs clear to auscultation bilaterally.   No wheezes, crackles, or rhonchi.  Cardiovascular:  Regular rate and rhythm. No peripheral edema, cyanosis or pallor.  Gastrointestinal:  Soft, nondistended, nontender. No rebound or guarding. Normal bowel sounds. No appreciable masses or hepatomegaly. Rectal:  Not performed.  Msk:  Symmetrical without gross deformities. Without edema, no deformity or joint abnormality.  Neurologic:  Alert and  oriented x4;  grossly normal neurologically.  Skin:   Dry and intact without significant lesions or rashes. Psychiatric: Oriented to person, place and time. Demonstrates good judgement and reason without abnormal affect or behaviors.  RELEVANT LABS AND IMAGING: CBC    Component Value Date/Time   WBC 13.3 (H) 09/20/2023 2253   RBC 5.13 (H) 09/20/2023 2253   HGB 13.7 09/20/2023 2253   HCT 42.1 09/20/2023 2253   PLT 355 09/20/2023 2253   MCV 82.1 09/20/2023 2253   MCH 26.7 09/20/2023 2253   MCHC 32.5 09/20/2023 2253   RDW 12.9 09/20/2023 2253    CMP     Component Value Date/Time   NA 139 11/14/2023 1602   K  4.5 11/14/2023 1602   CL 104 11/14/2023 1602   CO2 20 11/14/2023 1602   GLUCOSE 93 11/14/2023 1602   GLUCOSE 111 (H) 09/20/2023 2253   BUN 14 11/14/2023 1602   CREATININE 0.76 11/14/2023 1602   CALCIUM 9.5 11/14/2023 1602   PROT 6.9 11/14/2023 1602   ALBUMIN 4.2 11/14/2023 1602   AST 26 11/14/2023 1602   ALT 27 11/14/2023 1602   ALKPHOS 82 11/14/2023 1602   BILITOT 0.3 11/14/2023 1602   GFRNONAA >60 09/20/2023 2253     Assessment/Plan:   GERD Epigastric pain Breakthrough symptoms despite esomeprazole twice daily.  History of constipation so would prefer to avoid Carafate if possible.  Unrevealing EGD in 2024.  Recent CT scan with patulous distal esophagus, no gallstones. - Educated patient on lifestyle modifications and provided patient education handout - Continue esomeprazole 40 Mg twice daily - Add on famotidine 40 Mg at bedtime - If continued breakthrough symptoms we will do twice daily Carafate - Follow-up 8 to 12 weeks (patient instructed to message if no improvement with addition of famotidine in a few weeks).  IBS Alternating diarrhea and constipation with KUB confirming constipation.  Doing well on fiber and IBgard - Continue fiber and IBgard  Boone Master, PA-C [facility] 12/17/2023, 10:23 AM  Cc: Hilbert Bible, *

## 2023-12-22 ENCOUNTER — Encounter (HOSPITAL_BASED_OUTPATIENT_CLINIC_OR_DEPARTMENT_OTHER): Payer: Self-pay

## 2024-01-11 ENCOUNTER — Ambulatory Visit (HOSPITAL_BASED_OUTPATIENT_CLINIC_OR_DEPARTMENT_OTHER): Payer: Self-pay | Admitting: Student

## 2024-01-13 IMAGING — CT CT ABD-PELV W/ CM
2 of 4 series · 16 of 46 positions shown, 18 images · IV contrast (APPLIED)
Comparison: None.

CLINICAL DATA: Abdominal pain, acute, nonlocalized abd pain,
emesis, fever

EXAM:
CT ABDOMEN AND PELVIS WITH CONTRAST
TECHNIQUE: Multidetector CT imaging of the abdomen and pelvis was performed
using the standard protocol following bolus administration of
intravenous contrast.

[Series 2: abd pel w · axial · 0.72mm/px · z∈[-261,+164]mm · 13 of 93 slices shown, 15 images]
[im 4/93  soft-tissue]
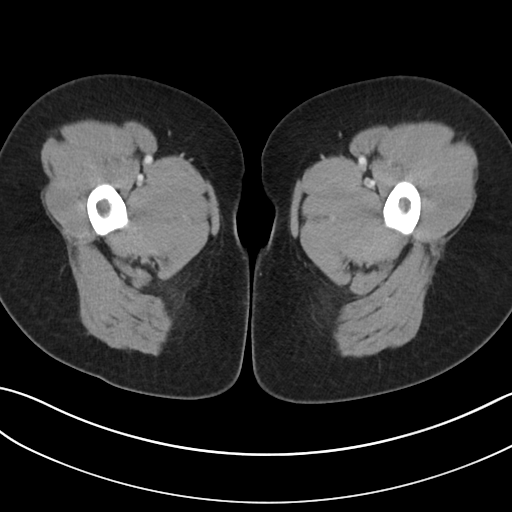
[im 4/93  bone]
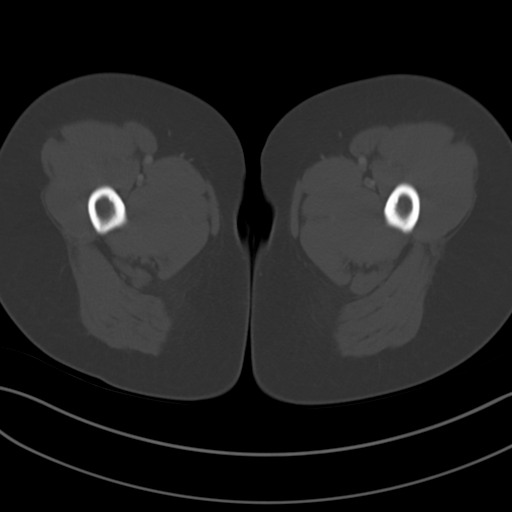
[im 12/93  soft-tissue]
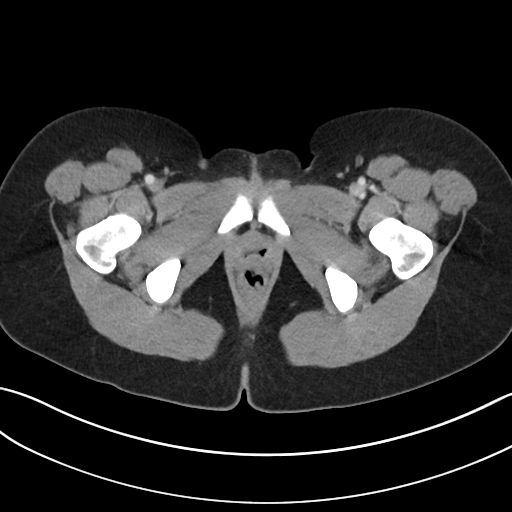
[im 19/93  soft-tissue]
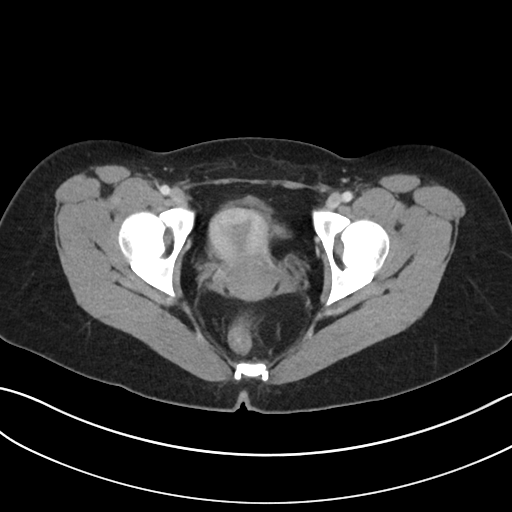
[im 26/93  soft-tissue]
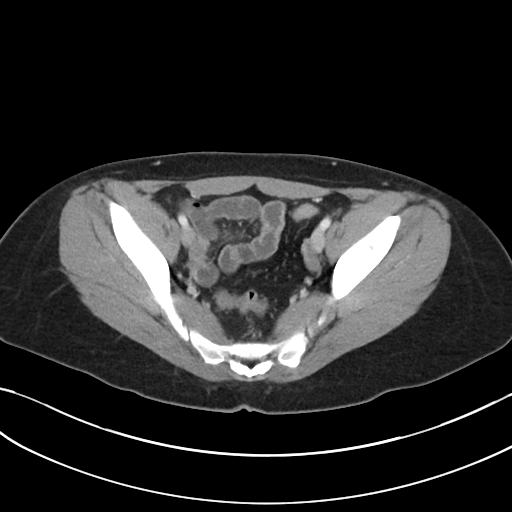
[im 34/93  soft-tissue]
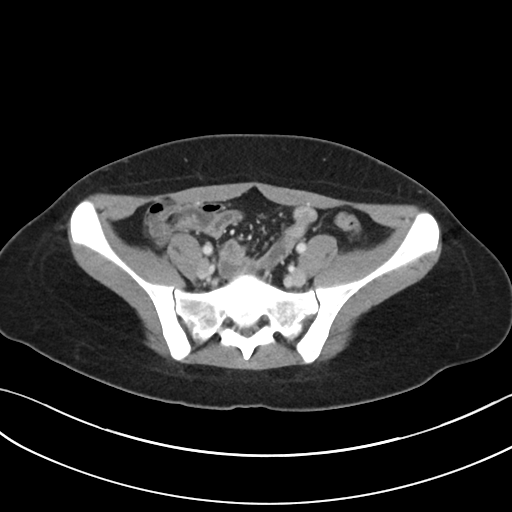
[im 41/93  soft-tissue]
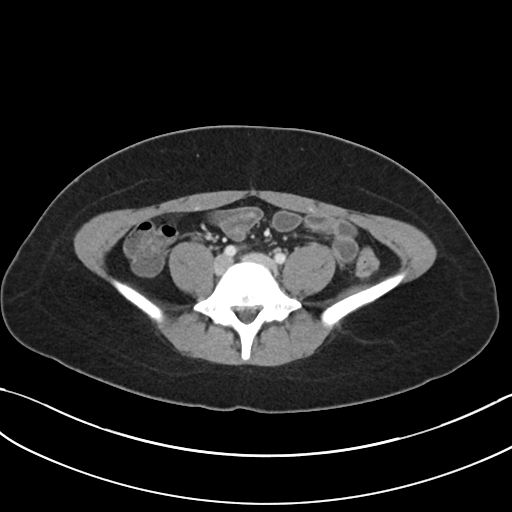
[im 48/93  soft-tissue]
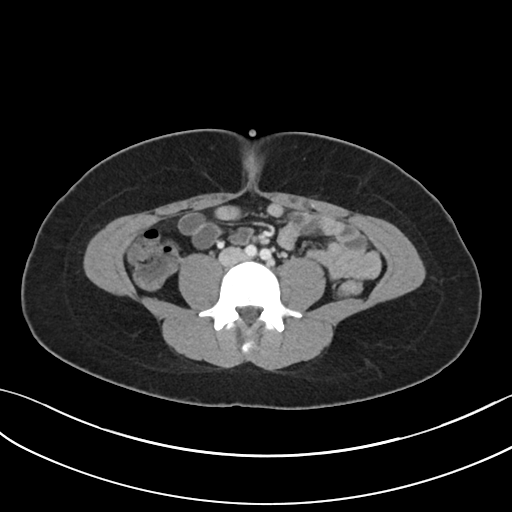
[im 52/93  soft-tissue]
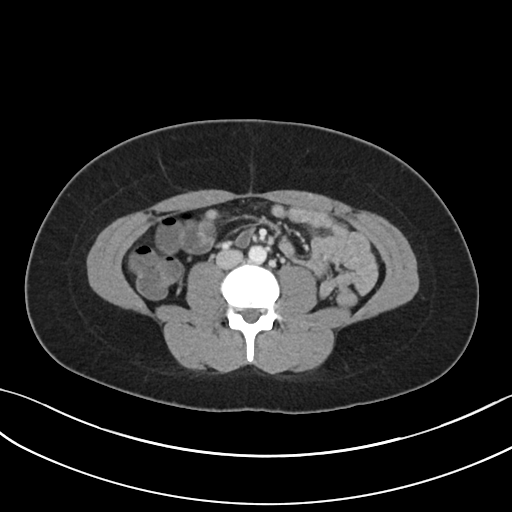
[im 59/93  soft-tissue]
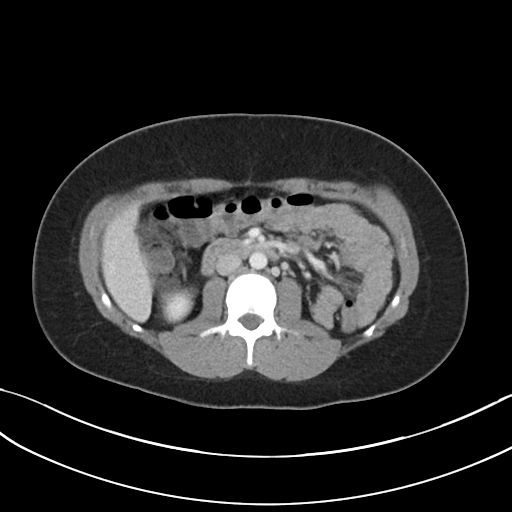
[im 59/93  bone]
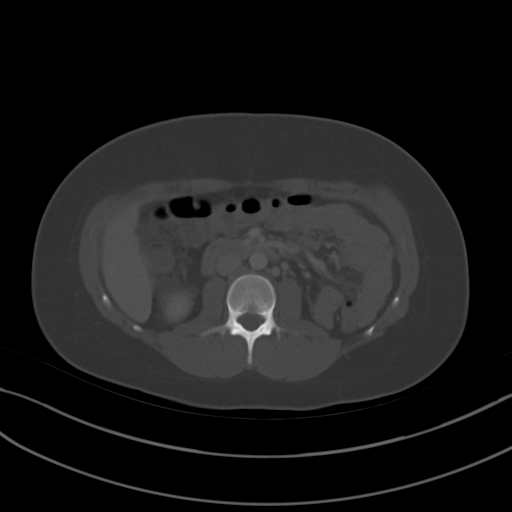
[im 67/93  soft-tissue]
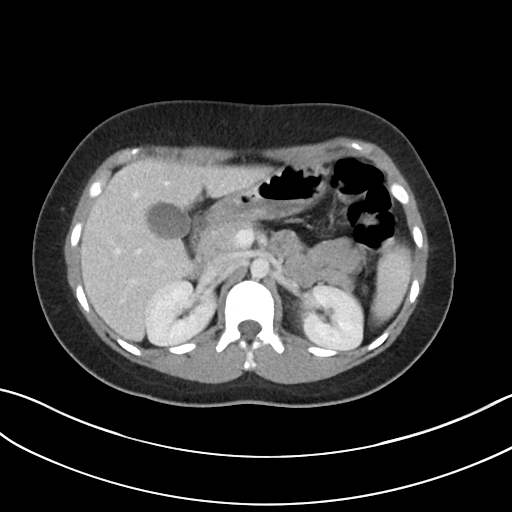
[im 74/93  soft-tissue]
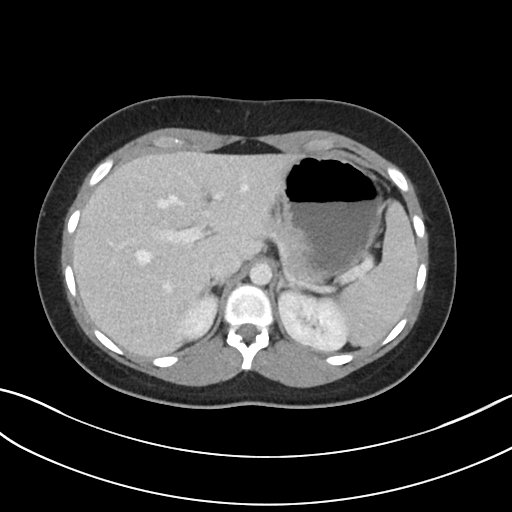
[im 81/93  soft-tissue]
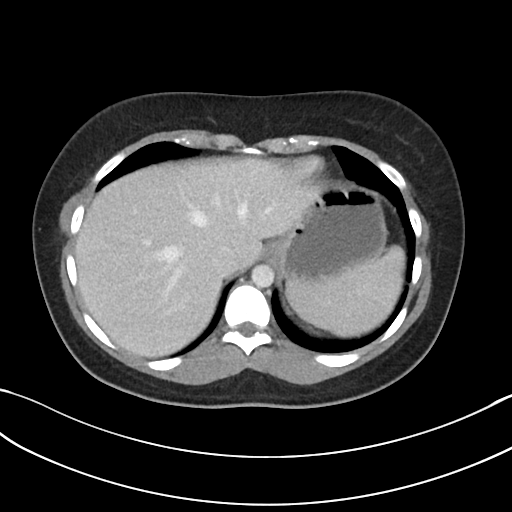
[im 89/93  soft-tissue]
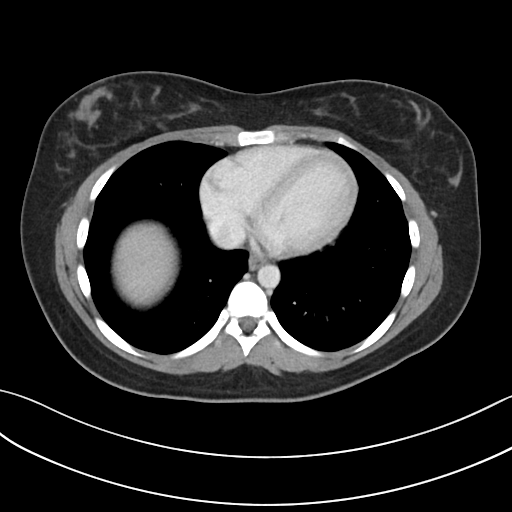

[Series 5: coronal · coronal · 0.71mm/px · 3 of 87 slices shown]
[im 29/87  soft-tissue]
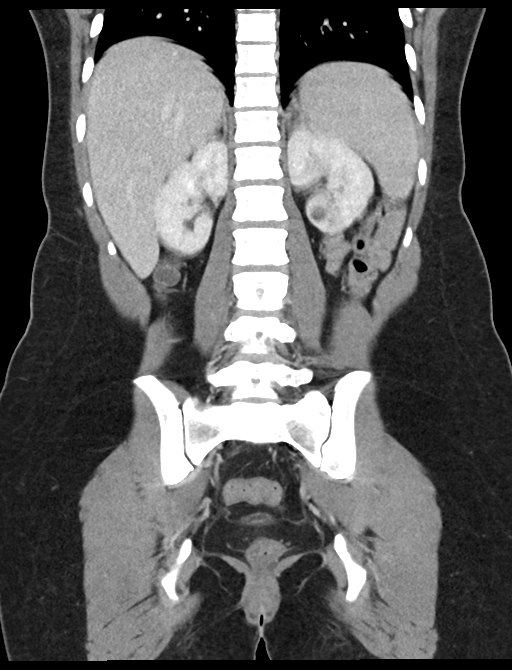
[im 39/87  soft-tissue]
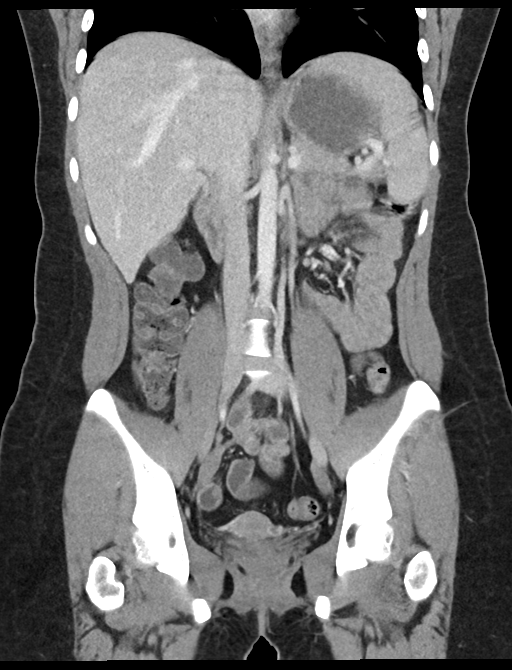
[im 48/87  soft-tissue]
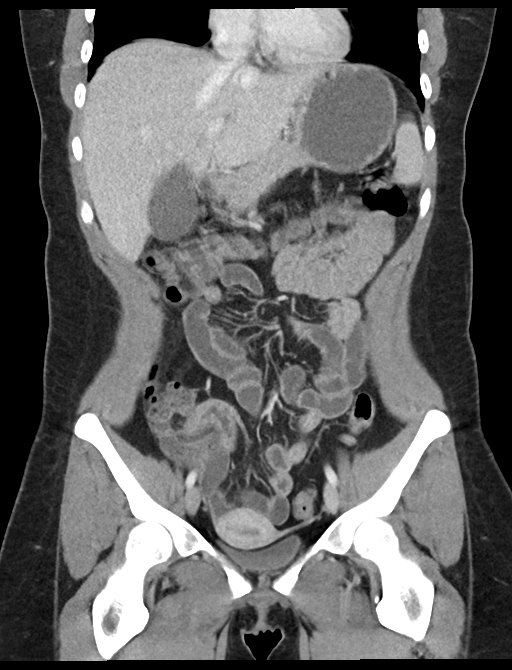

[16 of 46 positions shown; findings below may reference images not displayed]

RADIATION DOSE REDUCTION: This exam was performed according to the
departmental dose-optimization program which includes automated
exposure control, adjustment of the mA and/or kV according to
patient size and/or use of iterative reconstruction technique.

CONTRAST:  85mL OMNIPAQUE IOHEXOL 300 MG/ML  SOLN
FINDINGS: Lower chest: No acute abnormality.

Hepatobiliary: No focal liver abnormality is seen. The gallbladder
is unremarkable.

Pancreas: Unremarkable. No pancreatic ductal dilatation or
surrounding inflammatory changes.

Spleen: Normal in size without focal abnormality.

Adrenals/Urinary Tract: Adrenal glands are unremarkable. No
hydronephrosis or nephrolithiasis. The bladder is minimally
distended.

Stomach/Bowel: The stomach is within normal limits. There is no
evidence of bowel obstruction.The appendix is normal.

Vascular/Lymphatic: No significant vascular findings are present. No
enlarged abdominal or pelvic lymph nodes.

Reproductive: Unremarkable for age.

Other: No abdominal wall hernia or abnormality. No abdominopelvic
ascites.

Musculoskeletal: No acute or significant osseous findings.
IMPRESSION: No acute abdominopelvic abnormality.  Normal appendix.

## 2024-01-15 ENCOUNTER — Encounter (HOSPITAL_BASED_OUTPATIENT_CLINIC_OR_DEPARTMENT_OTHER): Admitting: Family Medicine

## 2024-02-17 ENCOUNTER — Telehealth: Payer: Self-pay

## 2024-02-17 DIAGNOSIS — J069 Acute upper respiratory infection, unspecified: Secondary | ICD-10-CM

## 2024-02-17 DIAGNOSIS — J4541 Moderate persistent asthma with (acute) exacerbation: Secondary | ICD-10-CM

## 2024-02-18 MED ORDER — PREDNISONE 10 MG (21) PO TBPK
ORAL_TABLET | ORAL | 0 refills | Status: DC
Start: 2024-02-18 — End: 2024-05-08

## 2024-02-18 MED ORDER — ALBUTEROL SULFATE (2.5 MG/3ML) 0.083% IN NEBU
2.5000 mg | INHALATION_SOLUTION | Freq: Four times a day (QID) | RESPIRATORY_TRACT | 0 refills | Status: AC | PRN
Start: 2024-02-18 — End: ?

## 2024-02-18 NOTE — Progress Notes (Signed)
 E-Visit for Cough   We are sorry that you are not feeling well.  Here is how we plan to help!  Based on your presentation I believe you most likely have A cough due to a virus.  This is called viral bronchitis and is best treated by rest, plenty of fluids and control of the cough.  You may use Ibuprofen or Tylenol as directed to help your symptoms.     In addition you may use A non-prescription cough medication called Mucinex DM: take 2 tablets every 12 hours.  I have prescribed: Prednisone  10 mg daily for 6 days (see taper instructions below)  Directions for 6 day taper: Day 1: 2 tablets before breakfast, 1 after both lunch & dinner and 2 at bedtime Day 2: 1 tab before breakfast, 1 after both lunch & dinner and 2 at bedtime Day 3: 1 tab at each meal & 1 at bedtime Day 4: 1 tab at breakfast, 1 at lunch, 1 at bedtime Day 5: 1 tab at breakfast & 1 tab at bedtime Day 6: 1 tab at breakfast  I have refilled your Albuterol  for your nebulizer machine.  From your responses in the eVisit questionnaire you describe inflammation in the upper respiratory tract which is causing a significant cough.  This is commonly called Bronchitis and has four common causes:   Allergies Viral Infections Acid Reflux Bacterial Infection Allergies, viruses and acid reflux are treated by controlling symptoms or eliminating the cause. An example might be a cough caused by taking certain blood pressure medications. You stop the cough by changing the medication. Another example might be a cough caused by acid reflux. Controlling the reflux helps control the cough.  USE OF BRONCHODILATOR ("RESCUE") INHALERS: There is a risk from using your bronchodilator too frequently.  The risk is that over-reliance on a medication which only relaxes the muscles surrounding the breathing tubes can reduce the effectiveness of medications prescribed to reduce swelling and congestion of the tubes themselves.  Although you feel brief relief  from the bronchodilator inhaler, your asthma may actually be worsening with the tubes becoming more swollen and filled with mucus.  This can delay other crucial treatments, such as oral steroid medications. If you need to use a bronchodilator inhaler daily, several times per day, you should discuss this with your provider.  There are probably better treatments that could be used to keep your asthma under control.     HOME CARE Only take medications as instructed by your medical team. Complete the entire course of an antibiotic. Drink plenty of fluids and get plenty of rest. Avoid close contacts especially the very young and the elderly Cover your mouth if you cough or cough into your sleeve. Always remember to wash your hands A steam or ultrasonic humidifier can help congestion.   GET HELP RIGHT AWAY IF: You develop worsening fever. You become short of breath You cough up blood. Your symptoms persist after you have completed your treatment plan MAKE SURE YOU  Understand these instructions. Will watch your condition. Will get help right away if you are not doing well or get worse.    Thank you for choosing an e-visit.  Your e-visit answers were reviewed by a board certified advanced clinical practitioner to complete your personal care plan. Depending upon the condition, your plan could have included both over the counter or prescription medications.  Please review your pharmacy choice. Make sure the pharmacy is open so you can pick up prescription now.  If there is a problem, you may contact your provider through [employer] and have the prescription routed to another pharmacy.  Your safety is important to us . If you have drug allergies check your prescription carefully.   For the next 24 hours you can use MyChart to ask questions about today's visit, request a non-urgent call back, or ask for a work or school excuse. You will get an email in the next two days asking about your  experience. I hope that your e-visit has been valuable and will speed your recovery.    I have spent 5 minutes in review of e-visit questionnaire, review and updating patient chart, medical decision making and response to patient.   Angelia Kelp, PA-C

## 2024-02-27 ENCOUNTER — Ambulatory Visit: Payer: Self-pay | Admitting: Gastroenterology

## 2024-04-22 ENCOUNTER — Encounter (HOSPITAL_BASED_OUTPATIENT_CLINIC_OR_DEPARTMENT_OTHER): Payer: Self-pay | Admitting: Family Medicine

## 2024-04-22 ENCOUNTER — Other Ambulatory Visit (HOSPITAL_BASED_OUTPATIENT_CLINIC_OR_DEPARTMENT_OTHER): Payer: Self-pay | Admitting: Family Medicine

## 2024-04-22 DIAGNOSIS — Z3044 Encounter for surveillance of vaginal ring hormonal contraceptive device: Secondary | ICD-10-CM

## 2024-04-22 MED ORDER — ETONOGESTREL-ETHINYL ESTRADIOL 0.12-0.015 MG/24HR VA RING: VAGINAL_RING | VAGINAL | 3 refills | Status: DC

## 2024-04-22 NOTE — Telephone Encounter (Signed)
 Please see mychart message sent by pt and advise.

## 2024-05-03 ENCOUNTER — Telehealth: Payer: Self-pay | Admitting: Family Medicine

## 2024-05-03 DIAGNOSIS — Z20811 Contact with and (suspected) exposure to meningococcus: Secondary | ICD-10-CM

## 2024-05-03 NOTE — Progress Notes (Signed)
 We do not treat meningitis via virtual or evisit's. If you think you have meningitis or have been exposed to meningitis and are symptomatic, you will need to be seen in person and evaluated.  I feel your condition warrants further evaluation and I recommend that you be seen in a face-to-face visit.   NOTE: There will be NO CHARGE for this E-Visit   If you are having a true medical emergency, please call 911.     For an urgent face to face visit, [place] has multiple urgent care centers for your convenience.  Click the link below for the full list of locations and hours, walk-in wait times, appointment scheduling options and driving directions:  Urgent Care - [place], [place], [place], [place], [place], [place]  [place]     Your MyChart E-visit questionnaire answers were reviewed by a board certified advanced clinical practitioner to complete your personal care plan based on your specific symptoms.    Thank you for using e-Visits.    I have spent 5 minutes in review of e-visit questionnaire, review and updating patient chart, medical decision making and response to patient.   Roosvelt Mater, PA-C

## 2024-05-07 NOTE — Progress Notes (Unsigned)
 Madison Console, PA-C [address] [place], [place]  [address] Phone: [phone]   Primary Care Physician: Knute Thersia Bitters, FNP  Primary Gastroenterologist:  Madison Console, PA-C / Lynnie Bring, MD   Chief Complaint:  F/U GERD, Epig Pain, IBS       HPI:   Madison Vega is a 23 y.o. female returns for 5 month f/u.  Last saw Madison Vega 11/2023 for f/u GERD and IBS with diarrhea and constipatoin.  10/2022 EGD by Dr. Bring: Small Hiatal Hernia, Otherwise Normal.  Biopsies Negative for Celiac and H. Pylori.  Esophageal bx consistent with reflux.  09/2023 Abd / Pelvic CT: Patulous distal esophagus  Current Tx:  Nexium  40mg  BID  Current Sx:  Current Outpatient Medications  Medication Sig Dispense Refill   albuterol  (PROVENTIL ) (2.5 MG/3ML) 0.083% nebulizer solution Take 3 mLs (2.5 mg total) by nebulization every 6 (six) hours as needed for wheezing or shortness of breath. 150 mL 0   benzonatate  (TESSALON  PERLES) 100 MG capsule Take 1 capsule (100 mg total) by mouth 3 (three) times daily as needed. (Patient not taking: Reported on 12/17/2023) 20 capsule 0   esomeprazole  (NEXIUM ) 40 MG capsule Take 1 capsule (40 mg total) by mouth 2 (two) times daily before a meal. 180 capsule 3   etonogestrel -ethinyl estradiol  (NUVARING) 0.12-0.015 MG/24HR vaginal ring Insert vaginally and leave in place for 3 consecutive weeks, then remove for 1 week. 9 each 3   famotidine  (PEPCID ) 40 MG tablet Take 1 tablet (40 mg total) by mouth daily. 30 tablet 2   hydrOXYzine  (ATARAX ) 10 MG tablet Take 1 tablet (10 mg total) by mouth at bedtime as needed. 60 tablet 3   predniSONE  (STERAPRED UNI-PAK 21 TAB) 10 MG (21) TBPK tablet 6 day taper; take as directed on package instructions 21 tablet 0   sertraline  (ZOLOFT ) 50 MG tablet Take 1 tablet (50 mg total) by mouth daily. 30 tablet 5   VENTOLIN  HFA 108 (90 Base) MCG/ACT inhaler Inhale 1 puff into the lungs every 6 (six) hours as needed for wheezing or  shortness of breath. 1 each 0   No current facility-administered medications for this visit.    Allergies as of 05/08/2024 - Review Complete 05/03/2024  Allergen Reaction Noted   Shellfish allergy Nausea And Vomiting, Other (See Comments), and Itching 02/06/2016   Duloxetine hcl Anxiety 09/07/2017    Past Medical History:  Diagnosis Date   Anxiety    Asthma    Depression    Gastroenteritis    GERD (gastroesophageal reflux disease)     No past surgical history on file.  Review of Systems:    All systems reviewed and negative except where noted in HPI.    Physical Exam:  There were no vitals taken for this visit. No LMP recorded.  General: Well-nourished, well-developed in no acute distress.  Lungs: Clear to auscultation bilaterally. Non-labored. Heart: Regular rate and rhythm, no murmurs rubs or gallops.  Abdomen: Bowel sounds are normal; Abdomen is Soft; No hepatosplenomegaly, masses or hernias;  No Abdominal Tenderness; No guarding or rebound tenderness. Neuro: Alert and oriented x 3.  Grossly intact.  Psych: Alert and cooperative, normal mood and affect.   Imaging Studies: No results found.  Labs: CBC    Component Value Date/Time   WBC 13.3 (H) 09/20/2023 2253   RBC 5.13 (H) 09/20/2023 2253   HGB 13.7 09/20/2023 2253   HCT 42.1 09/20/2023 2253   PLT 355 09/20/2023 2253  MCV 82.1 09/20/2023 2253   MCH 26.7 09/20/2023 2253   MCHC 32.5 09/20/2023 2253   RDW 12.9 09/20/2023 2253    CMP     Component Value Date/Time   NA 139 11/14/2023 1602   K 4.5 11/14/2023 1602   CL 104 11/14/2023 1602   CO2 20 11/14/2023 1602   GLUCOSE 93 11/14/2023 1602   GLUCOSE 111 (H) 09/20/2023 2253   BUN 14 11/14/2023 1602   CREATININE 0.76 11/14/2023 1602   CALCIUM 9.5 11/14/2023 1602   PROT 6.9 11/14/2023 1602   ALBUMIN 4.2 11/14/2023 1602   AST 26 11/14/2023 1602   ALT 27 11/14/2023 1602   ALKPHOS 82 11/14/2023 1602   BILITOT 0.3 11/14/2023 1602   GFRNONAA >60  09/20/2023 2253       Assessment and Plan:   Madison Vega is a 23 y.o. y/o female returns for f/u GERD and IBS.  GERD   IBS-Mixed    Madison Console, PA-C  Follow up PRN with Dr. Charlanne or his PA

## 2024-05-08 ENCOUNTER — Encounter: Payer: Self-pay | Admitting: Physician Assistant

## 2024-05-08 ENCOUNTER — Emergency Department (HOSPITAL_COMMUNITY)
Admission: EM | Admit: 2024-05-08 | Discharge: 2024-05-08 | Disposition: A | Discharge status: Home / Self Care | Payer: Worker's Compensation | Attending: Emergency Medicine | Admitting: Emergency Medicine

## 2024-05-08 ENCOUNTER — Ambulatory Visit (INDEPENDENT_AMBULATORY_CARE_PROVIDER_SITE_OTHER): Payer: Self-pay | Admitting: Physician Assistant

## 2024-05-08 ENCOUNTER — Other Ambulatory Visit: Payer: Self-pay

## 2024-05-08 ENCOUNTER — Encounter (HOSPITAL_COMMUNITY): Payer: Self-pay | Admitting: Emergency Medicine

## 2024-05-08 VITALS — BP 100/70 | HR 77 | Ht 59.0 in | Wt 142.1 lb

## 2024-05-08 DIAGNOSIS — K219 Gastro-esophageal reflux disease without esophagitis: Secondary | ICD-10-CM

## 2024-05-08 DIAGNOSIS — Z7721 Contact with and (suspected) exposure to potentially hazardous body fluids: Secondary | ICD-10-CM | POA: Diagnosis present

## 2024-05-08 DIAGNOSIS — R1031 Right lower quadrant pain: Secondary | ICD-10-CM

## 2024-05-08 DIAGNOSIS — R1030 Lower abdominal pain, unspecified: Secondary | ICD-10-CM

## 2024-05-08 DIAGNOSIS — R197 Diarrhea, unspecified: Secondary | ICD-10-CM

## 2024-05-08 DIAGNOSIS — K625 Hemorrhage of anus and rectum: Secondary | ICD-10-CM

## 2024-05-08 LAB — CBC WITH DIFFERENTIAL/PLATELET
Basophils Absolute: 0 K/uL (ref 0.0–0.1)
Basophils Relative: 0.5 % (ref 0.0–3.0)
Eosinophils Absolute: 0.6 K/uL (ref 0.0–0.7)
Eosinophils Relative: 7.7 % — ABNORMAL HIGH (ref 0.0–5.0)
HCT: 36.4 % (ref 36.0–46.0)
Hemoglobin: 12.4 g/dL (ref 12.0–15.0)
Lymphocytes Relative: 38.9 % (ref 12.0–46.0)
Lymphs Abs: 2.9 K/uL (ref 0.7–4.0)
MCHC: 34.1 g/dL (ref 30.0–36.0)
MCV: 82.1 fl (ref 78.0–100.0)
Monocytes Absolute: 0.6 K/uL (ref 0.1–1.0)
Monocytes Relative: 7.9 % (ref 3.0–12.0)
Neutro Abs: 3.3 K/uL (ref 1.4–7.7)
Neutrophils Relative %: 45 % (ref 43.0–77.0)
Platelets: 308 K/uL (ref 150.0–400.0)
RBC: 4.43 Mil/uL (ref 3.87–5.11)
RDW: 13.2 % (ref 11.5–15.5)
WBC: 7.4 K/uL (ref 4.0–10.5)

## 2024-05-08 LAB — COMPREHENSIVE METABOLIC PANEL WITH GFR
ALT: 18 U/L (ref 0–35)
AST: 15 U/L (ref 0–37)
Albumin: 4.2 g/dL (ref 3.5–5.2)
Alkaline Phosphatase: 102 U/L (ref 39–117)
BUN: 9 mg/dL (ref 6–23)
CO2: 24 meq/L (ref 19–32)
Calcium: 8.8 mg/dL (ref 8.4–10.5)
Chloride: 108 meq/L (ref 96–112)
Creatinine, Ser: 0.81 mg/dL (ref 0.40–1.20)
GFR: 102.63 mL/min (ref >60.00)
Glucose, Bld: 88 mg/dL (ref 70–99)
Potassium: 3.7 meq/L (ref 3.5–5.1)
Sodium: 140 meq/L (ref 135–145)
Total Bilirubin: 0.3 mg/dL (ref 0.2–1.2)
Total Protein: 6.8 g/dL (ref 6.0–8.3)

## 2024-05-08 LAB — RAPID HIV SCREEN (HIV 1/2 AB+AG)
HIV 1/2 Antibodies: NONREACTIVE
HIV-1 P24 Antigen - HIV24: NONREACTIVE

## 2024-05-08 LAB — HEPATITIS B SURFACE ANTIGEN: Hepatitis B Surface Ag: NONREACTIVE

## 2024-05-08 LAB — C-REACTIVE PROTEIN: CRP: <1 mg/dL (ref 0.5–20.0)

## 2024-05-08 NOTE — ED Triage Notes (Signed)
 Pt stuck by an im needle after it was in the patient

## 2024-05-08 NOTE — Discharge Instructions (Addendum)
 Today you were seen for a bodily fluid exposure.  You have 1 more labs pending and will be notified of these results.  Please follow-up with [facility] for further evaluation workup.  Thank you for letting us  treat you today. After performing a physical exam, I feel you are safe to go home. Please follow up with your PCP in the next several days and provide them with your records from this visit. Return to the Emergency Room if pain becomes severe or symptoms worsen.
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
## 2024-05-08 NOTE — Patient Instructions (Addendum)
 Your provider has requested that you go to the basement level for lab work before leaving today. Press B on the elevator. The lab is located at the first door on the left as you exit the elevator.  You have been scheduled for a Colonoscopy. Please follow written instructions given to you at your visit today.   If you use inhalers (even only as needed), please bring them with you on the day of your procedure.  DO NOT TAKE 7 DAYS PRIOR TO TEST- Trulicity (dulaglutide) Ozempic, Wegovy (semaglutide) Mounjaro (tirzepatide) Bydureon Bcise (exanatide extended release)  DO NOT TAKE 1 DAY PRIOR TO YOUR TEST Rybelsus (semaglutide) Adlyxin (lixisenatide) Victoza (liraglutide) Byetta (exanatide) ___________________________________________________________________________  Please follow up sooner if symptoms increase or worsen   Due to recent changes in healthcare laws, you may see the results of your imaging and laboratory studies on MyChart before your provider has had a chance to review them.  We understand that in some cases there may be results that are confusing or concerning to you. Not all laboratory results come back in the same time frame and the provider may be waiting for multiple results in order to interpret others.  Please give us  48 hours in order for your provider to thoroughly review all the results before contacting the office for clarification of your results.   Thank you for trusting me with your gastrointestinal care!   Ellouise Console, PA-C _______________________________________________________  If your blood pressure at your visit was 140/90 or greater, please contact your primary care physician to follow up on this.  _______________________________________________________  If you are age 23 or older, your body mass index should be between 23-30. Your Body mass index is 28.71 kg/m. If this is out of the aforementioned range listed, please consider follow up with your  Primary Care Provider.  If you are age 22 or younger, your body mass index should be between 19-25. Your Body mass index is 28.71 kg/m. If this is out of the aformentioned range listed, please consider follow up with your Primary Care Provider.   ________________________________________________________  The [facility] providers would like to encourage you to use MYCHART to communicate with providers for non-urgent requests or questions.  Due to long hold times on the telephone, sending your provider a message by [facility] may be a faster and more efficient way to get a response.  Please allow 48 business hours for a response.  Please remember that this is for non-urgent requests.  _______________________________________________________

## 2024-05-08 NOTE — Progress Notes (Signed)
 Madison Vega

## 2024-05-08 NOTE — Progress Notes (Signed)
 Agree with assessment/plan.  Edman Circle, MD [facility] [phone]

## 2024-05-09 ENCOUNTER — Ambulatory Visit: Payer: Self-pay | Admitting: Physician Assistant

## 2024-05-09 ENCOUNTER — Other Ambulatory Visit: Payer: Self-pay

## 2024-05-09 DIAGNOSIS — R197 Diarrhea, unspecified: Secondary | ICD-10-CM

## 2024-05-09 LAB — HCV INTERPRETATION

## 2024-05-09 LAB — HCV AB W REFLEX TO QUANT PCR: HCV Ab: NONREACTIVE

## 2024-05-10 LAB — CLOSTRIDIUM DIFFICILE BY PCR: Toxigenic C. Difficile by PCR: NEGATIVE

## 2024-05-11 LAB — CELIAC DISEASE AB SCREEN W/RFX
Antigliadin Abs, IgA: 2 U (ref 0–19)
IgA/Immunoglobulin A, Serum: 128 mg/dL (ref 87–352)
Transglutaminase IgA: <2 U/mL (ref 0–3)

## 2024-05-13 ENCOUNTER — Encounter (HOSPITAL_BASED_OUTPATIENT_CLINIC_OR_DEPARTMENT_OTHER): Payer: Self-pay | Admitting: *Deleted

## 2024-05-13 LAB — STOOL CULTURE: E coli, Shiga toxin Assay: NEGATIVE

## 2024-05-13 LAB — CALPROTECTIN, FECAL: Calprotectin, Fecal: 37 ug/g (ref 0–120)

## 2024-06-17 ENCOUNTER — Other Ambulatory Visit: Payer: Self-pay

## 2024-06-17 ENCOUNTER — Ambulatory Visit
Admission: EM | Admit: 2024-06-17 | Discharge: 2024-06-17 | Disposition: A | Discharge status: Home / Self Care | Payer: Self-pay | Attending: Physician Assistant | Admitting: Physician Assistant

## 2024-06-17 DIAGNOSIS — M654 Radial styloid tenosynovitis [de Quervain]: Secondary | ICD-10-CM

## 2024-06-17 NOTE — ED Provider Notes (Signed)
 [facility]    CSN: [id number] Arrival date & time: 06/17/24  1439      History   Chief Complaint Chief Complaint  Patient presents with   Hand Pain    HPI Madison Vega is a 23 y.o. female.  has a past medical history of Anxiety, Asthma, Depression, Gastroenteritis, and GERD (gastroesophageal reflux disease).   HPI  Discussed the use of AI scribe software for clinical note transcription with the patient, who gave verbal consent to proceed.  The patient presents with right thumb pain and swelling.  She has been experiencing pain in her right thumb for the past four to five days. Initially, the pain was localized to the thumb, but it began to radiate upwards last night, accompanied by a popping noise upon movement. The pain is primarily in the thumb but extends with a pins and needles sensation up the arm and down to the fingertips.  Swelling in the thumb was noticed last night. She took ibuprofen, which provided some relief in pain and swelling. She has also been using ice and trying to rest the thumb, avoiding movement as much as possible to prevent discomfort.  No known injuries or trauma that she can recall. However, the pain increases with certain movements, particularly when turning the thumb or moving it side to side.  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 She is scheduled to work tomorrow, Thursday, and Friday.   Past Medical History:  Diagnosis Date   Anxiety    Asthma    Depression    Gastroenteritis    GERD (gastroesophageal reflux disease)     Patient Active Problem List   Diagnosis Date Noted   GAD (generalized anxiety disorder) 10/22/2023   GERD (gastroesophageal reflux disease) 10/05/2022   Depressive disorder 07/27/2022   Asthma 10/08/2006    History reviewed. No pertinent surgical history.  OB History   No obstetric history on file.      Home  Medications    Prior to Admission medications   Medication Sig Start Date End Date Taking? Authorizing Provider  albuterol  (PROVENTIL ) (2.5 MG/3ML) 0.083% nebulizer solution Take 3 mLs (2.5 mg total) by nebulization every 6 (six) hours as needed for wheezing or shortness of breath. 02/18/24   Vivienne Delon HERO, PA-C  esomeprazole  (NEXIUM ) 40 MG capsule Take 1 capsule (40 mg total) by mouth 2 (two) times daily before a meal. 10/22/23   Caudle, Thersia Bitters, FNP  etonogestrel -ethinyl estradiol  (NUVARING) 0.12-0.015 MG/24HR vaginal ring Insert vaginally and leave in place for 3 consecutive weeks, then remove for 1 week. 04/22/24   Caudle, Thersia Bitters, FNP  hydrOXYzine  (ATARAX ) 10 MG tablet Take 1 tablet (10 mg total) by mouth at bedtime as needed. 05/24/23   Caudle, Thersia Bitters, FNP  sertraline  (ZOLOFT ) 50 MG tablet Take 1 tablet (50 mg total) by mouth daily. 10/22/23   Caudle, Thersia Bitters, FNP  VENTOLIN  HFA 108 (90 Base) MCG/ACT inhaler Inhale 1 puff into the lungs every 6 (six) hours as needed for wheezing or shortness of breath. 07/16/23   Caudle, Thersia Bitters, FNP    Family History Family History  Problem Relation Age of Onset   Breast cancer Paternal Grandmother    Colon cancer Paternal Great-grandmother    Stomach cancer Neg Hx    Esophageal cancer Neg Hx    Rectal cancer Neg Hx     Social History Social History  Tobacco Use   Smoking status: Never   Smokeless tobacco: Never  Vaping Use   Vaping status: Former  Substance Use Topics   Alcohol use: Yes    Comment: Occ   Drug use: Never     Allergies   Shellfish allergy and Duloxetine hcl   Review of Systems Review of Systems  Musculoskeletal:        Right thumb and wrist pain       Physical Exam Triage Vital Signs ED Triage Vitals  Encounter Vitals Group     BP 06/17/24 1501 121/84     Girls Systolic BP Percentile --      Girls Diastolic BP Percentile --      Boys Systolic BP Percentile --      Boys  Diastolic BP Percentile --      Pulse Rate 06/17/24 1501 83     Resp 06/17/24 1501 16     Temp 06/17/24 1501 98.4 F (36.9 C)     Temp Source 06/17/24 1501 Oral     SpO2 06/17/24 1501 97 %     Weight 06/17/24 1501 138 lb (62.6 kg)     Height 06/17/24 1501 4' 11 (1.499 m)     Head Circumference --      Peak Flow --      Pain Score 06/17/24 1509 2     Pain Loc --      Pain Education --      Exclude from Growth Chart --    No data found.  Updated Vital Signs BP 121/84 (BP Location: Right Arm)   Pulse 83   Temp 98.4 F (36.9 C) (Oral)   Resp 16   Ht 4' 11 (1.499 m)   Wt 138 lb (62.6 kg)   LMP 05/22/2024 (Exact Date)   SpO2 97%   BMI 27.87 kg/m   Visual Acuity Right Eye Distance:   Left Eye Distance:   Bilateral Distance:    Right Eye Near:   Left Eye Near:    Bilateral Near:     Physical Exam Vitals reviewed.  Constitutional:      General: She is awake.     Appearance: Normal appearance. She is well-developed and well-groomed.  HENT:     Head: Normocephalic and atraumatic.  Eyes:     General: Lids are normal. Gaze aligned appropriately.     Extraocular Movements: Extraocular movements intact.     Conjunctiva/sclera: Conjunctivae normal.  Pulmonary:     Effort: Pulmonary effort is normal.  Musculoskeletal:     Right wrist: Tenderness present. No swelling. Decreased range of motion. Normal pulse.     Left wrist: Normal. Normal pulse.     Right hand: Tenderness present. No swelling or deformity. Normal range of motion. Normal capillary refill. Normal pulse.     Left hand: Normal.     Comments: Positive Finkelstein's testing on the right. Patient has limited ability to extend the right wrist but flexion is intact.  She reports limited ability to radially flex the wrist as well but ulnar flexion is intact.  Radial pulses 2+ and brisk.  She has intact flexion and extension of the fingers No obvious evidence of swelling or deformity to the wrist or hands   Neurological:     Mental Status: She is alert and oriented to person, place, and time.  Psychiatric:        Attention and Perception: Attention and perception normal.        Mood and  Affect: Mood and affect normal.        Speech: Speech normal.        Behavior: Behavior normal. Behavior is cooperative.      UC Treatments / Results  Labs (all labs ordered are listed, but only abnormal results are displayed) Labs Reviewed - No data to display  EKG   Radiology No results found.  Procedures Procedures (including critical care time)  Medications Ordered in UC Medications - No data to display  Initial Impression / Assessment and Plan / UC Course  I have reviewed the triage vital signs and the nursing notes.  Pertinent labs & imaging results that were available during my care of the patient were reviewed by me and considered in my medical decision making (see chart for details).      Final Clinical Impressions(s) / UC Diagnoses   Final diagnoses:  De Quervain's tenosynovitis, right  De Quervain's tenosynovitis, right wrist/thumb De Quervain's tenosynovitis in the right wrist/thumb with symptoms of pain, swelling, popping noise with movement, and paresthesia extending up the arm and down to the fingertips. Symptoms began approximately four to five days ago without specific injury or trauma. Pain partially relieved by ibuprofen and ice. Likely due to inflammation of the tendon running through the thumb and forearm, common in individuals with extensive hand or thumb use. - Provide a brace to stabilize the thumb and prevent overuse. - Recommend alternating acetaminophen and ibuprofen for pain management. - Advise warm compresses, gentle exercises, and massage to alleviate symptoms. - Instruct to follow up with orthopedics if no improvement or worsening of symptoms in two weeks. - Provide work excuse for three days to allow rest and recovery.    Discharge Instructions       VISIT SUMMARY:  You came in today because of pain and swelling in your right thumb that started about four to five days ago. The pain has been getting worse and is now radiating up your arm with a pins and needles sensation. You also heard a popping noise when moving your thumb. You have been using ibuprofen, which have helped somewhat.  YOUR PLAN:  -DE QUERVAIN'S TENOSYNOVITIS, RIGHT WRIST/THUMB: De Quervain's tenosynovitis is an inflammation of the tendons in the thumb and wrist, often caused by repetitive hand or thumb movements. To help manage this, you will be provided with a brace to stabilize your thumb and prevent overuse. You should alternate between acetaminophen and ibuprofen for pain relief. Additionally, using warm compresses, doing gentle exercises, and massaging the area can help alleviate symptoms. If your symptoms do not improve or get worse in two weeks, you should follow up with orthopedics. You will also be given a work excuse for three days to allow your thumb to rest and recover.  INSTRUCTIONS:  Please follow up with orthopedics if your symptoms do not improve or worsen in two weeks. You have been given a work excuse for three days to allow your thumb to rest and recover.     ED Prescriptions   None    PDMP not reviewed this encounter.   Inella Kuwahara, Rocky BRAVO, PA-C 06/17/24 1606

## 2024-06-17 NOTE — ED Triage Notes (Addendum)
 Pt presents with a chief complaint of right thumb pain x 5 days. Pt describes the pain as pins and needles. Started to radiate to the wrist, shooting up the right arm yesterday. Rates overall pain a 2/10 at this time. However with movement, pain increases to a 7/10. OTC Ibuprofen taken with relief in pain. Swelling also noted by pt. No known injuries.

## 2024-06-17 NOTE — Discharge Instructions (Addendum)
 VISIT SUMMARY:  You came in today because of pain and swelling in your right thumb that started about four to five days ago. The pain has been getting worse and is now radiating up your arm with a pins and needles sensation. You also heard a popping noise when moving your thumb. You have been using ibuprofen, which have helped somewhat.  YOUR PLAN:  -DE QUERVAIN'S TENOSYNOVITIS, RIGHT WRIST/THUMB: De Quervain's tenosynovitis is an inflammation of the tendons in the thumb and wrist, often caused by repetitive hand or thumb movements. To help manage this, you will be provided with a brace to stabilize your thumb and prevent overuse. You should alternate between acetaminophen and ibuprofen for pain relief. Additionally, using warm compresses, doing gentle exercises, and massaging the area can help alleviate symptoms. If your symptoms do not improve or get worse in two weeks, you should follow up with orthopedics. You will also be given a work excuse for three days to allow your thumb to rest and recover.  INSTRUCTIONS:  Please follow up with orthopedics if your symptoms do not improve or worsen in two weeks. You have been given a work excuse for three days to allow your thumb to rest and recover.

## 2024-06-19 DIAGNOSIS — M25531 Pain in right wrist: Secondary | ICD-10-CM | POA: Insufficient documentation

## 2024-07-04 ENCOUNTER — Ambulatory Visit (AMBULATORY_SURGERY_CENTER): Payer: Self-pay | Admitting: Gastroenterology

## 2024-07-04 ENCOUNTER — Encounter: Payer: Self-pay | Admitting: Gastroenterology

## 2024-07-04 VITALS — BP 100/59 | HR 67 | Temp 97.9°F | Resp 16 | Ht 59.0 in | Wt 142.0 lb

## 2024-07-04 DIAGNOSIS — R197 Diarrhea, unspecified: Secondary | ICD-10-CM | POA: Diagnosis not present

## 2024-07-04 DIAGNOSIS — K640 First degree hemorrhoids: Secondary | ICD-10-CM | POA: Diagnosis present

## 2024-07-04 DIAGNOSIS — K625 Hemorrhage of anus and rectum: Secondary | ICD-10-CM

## 2024-07-04 MED ORDER — SODIUM CHLORIDE 0.9 % IV SOLN: 500.0000 mL | INTRAVENOUS | Status: DC

## 2024-07-04 NOTE — Op Note (Signed)
 [facility] Patient Name: Madison Vega Procedure Date: 07/04/2024 2:02 PM MRN: [id number] Endoscopist: Lynnie Bring , MD, [id number] Age: 23 Referring MD:  Date of Birth: 07-06-2001 Gender: Female Account #: [id number] Procedure:                Colonoscopy Indications:              Clinically significant diarrhea of unexplained                            origin, Rectal bleeding Medicines:                Monitored Anesthesia Care Procedure:                Pre-Anesthesia Assessment:                           - Prior to the procedure, a History and Physical                            was performed, and patient medications and                            allergies were reviewed. The patient's tolerance of                            previous anesthesia was also reviewed. The risks                            and benefits of the procedure and the sedation                            options and risks were discussed with the patient.                            All questions were answered, and informed consent                            was obtained. Prior Anticoagulants: The patient has                            taken no anticoagulant or antiplatelet agents. ASA                            Grade Assessment: I - A normal, healthy patient.                            After reviewing the risks and benefits, the patient                            was deemed in satisfactory condition to undergo the                            procedure.  After obtaining informed consent, the colonoscope                            was passed under direct vision. Throughout the                            procedure, the patient's blood pressure, pulse, and                            oxygen saturations were monitored continuously. The                            Olympus Scope SN [id number] was introduced through the                            anus and advanced to the 6 cm into the ileum. The                             colonoscopy was performed without difficulty. The                            patient tolerated the procedure well. The quality                            of the bowel preparation was adequate to identify                            polyps. The terminal ileum, ileocecal valve,                            appendiceal orifice, and rectum were photographed. Scope In: 2:11:03 PM Scope Out: 2:26:32 PM Scope Withdrawal Time: 0 hours 11 minutes 55 seconds  Total Procedure Duration: 0 hours 15 minutes 29 seconds  Findings:                 The colon (entire examined portion) appeared                            normal. Biopsies for histology were taken with a                            cold forceps from the entire colon for evaluation                            of microscopic colitis.                           The terminal ileum appeared normal. Biopsies were                            taken with a cold forceps for histology.                           Non-bleeding internal hemorrhoids were found during  retroflexion. The hemorrhoids were small and Grade                            0-I (internal hemorrhoids that do not prolapse).                           Retroflexion in the right colon was performed.                           The exam was otherwise without abnormality on                            direct and retroflexion views. Complications:            No immediate complications. Estimated Blood Loss:     Estimated blood loss: none. Impression:               - The entire examined colon is normal. Biopsied.                           - The examined portion of the ileum was normal.                            Biopsied.                           - Very minimal non-bleeding internal hemorrhoids.                           - The examination was otherwise normal on direct                            and retroflexion views. Recommendation:           -  Patient has a contact number available for                            emergencies. The signs and symptoms of potential                            delayed complications were discussed with the                            patient. Return to normal activities tomorrow.                            Written discharge instructions were provided to the                            patient.                           - Resume previous diet.                           - Continue present medications. Can reduce Nexium   to every other day.                           - Await pathology results.                           - Repeat colonoscopy at age 60 as per current                            recommendations for CRC screening. Earlier, if with                            any new problems.                           - The findings and recommendations were discussed                            with the patient's family.                           - FU if still with problems. Lynnie Bring, MD 07/04/2024 2:31:56 PM This report has been signed electronically.

## 2024-07-04 NOTE — Patient Instructions (Addendum)
 Resume previous diet.                           - Continue present medications. Can reduce Nexium                             to every other day.                           - Await pathology results.                           - Repeat colonoscopy at age 23 as per current                            recommendations for CRC screening. Earlier, if with                            any new problems.                           - The findings and recommendations were discussed                            with the patient's family.                           - FU if still with problems.   YOU HAD AN ENDOSCOPIC PROCEDURE TODAY AT THE [facility]:   Refer to the procedure report that was given to you for any specific questions about what was found during the examination.  If the procedure report does not answer your questions, please call your gastroenterologist to clarify.  If you requested that your care partner not be given the details of your procedure findings, then the procedure report has been included in a sealed envelope for you to review at your convenience later.  YOU SHOULD EXPECT: Some feelings of bloating in the abdomen. Passage of more gas than usual.  Walking can help get rid of the air that was put into your GI tract during the procedure and reduce the bloating. If you had a lower endoscopy (such as a colonoscopy or flexible sigmoidoscopy) you may notice spotting of blood in your stool or on the toilet paper. If you underwent a bowel prep for your procedure, you may not have a normal bowel movement for a few days.  Please Note:  You might notice some irritation and congestion in your nose or some drainage.  This is from the oxygen used during your procedure.  There is no need for concern and it should clear up in a day or so.  SYMPTOMS TO REPORT IMMEDIATELY:  Following lower endoscopy (colonoscopy or flexible sigmoidoscopy):  Excessive amounts of blood in the stool  Significant  tenderness or worsening of abdominal pains  Swelling of the abdomen that is new, acute  Fever of 100F or higher  For urgent or emergent issues, a gastroenterologist can be reached at any hour by calling [phone]. Do not use MyChart messaging for urgent concerns.    DIET:  We do recommend a small meal at first, but then you may  proceed to your regular diet.  Drink plenty of fluids but you should avoid alcoholic beverages for 24 hours.  ACTIVITY:  You should plan to take it easy for the rest of today and you should NOT DRIVE or use heavy machinery until tomorrow (because of the sedation medicines used during the test).    FOLLOW UP: Our staff will call the number listed on your records the next business day following your procedure.  We will call around 7:15- 8:00 am to check on you and address any questions or concerns that you may have regarding the information given to you following your procedure. If we do not reach you, we will leave a message.     If any biopsies were taken you will be contacted by phone or by letter within the next 1-3 weeks.  Please call us  at [phone] if you have not heard about the biopsies in 3 weeks.    SIGNATURES/CONFIDENTIALITY: You and/or your care partner have signed paperwork which will be entered into your electronic medical record.  These signatures attest to the fact that that the information above on your After Visit Summary has been reviewed and is understood.  Full responsibility of the confidentiality of this discharge information lies with you and/or your care-partner.

## 2024-07-04 NOTE — Progress Notes (Signed)
 A/o x 3, VSS, good SR's, pleased with anesthesia, report to RN

## 2024-07-04 NOTE — Progress Notes (Signed)
 Called to room to assist during endoscopic procedure.  Patient ID and intended procedure confirmed with present staff. Received instructions for my participation in the procedure from the performing physician.

## 2024-07-04 NOTE — Progress Notes (Signed)
 Ellouise Console, PA-C [address], [place]  [address] Phone: [phone]     Primary Care Physician: Knute Thersia Bitters, FNP   Primary Gastroenterologist:  Ellouise Console, PA-C / Lynnie Bring, MD    Chief Complaint:   Diarrhea, rectal bleeding    HPI:   Madison Vega is a 23 y.o. female returns for 5 month f/u.  Last saw Nestor Blower, PA-C 11/2023 for f/u GERD and IBS with diarrhea and constipatoin.   10/2022 EGD by Dr. Bring: Small Hiatal Hernia, Otherwise Normal.  Biopsies Negative for Celiac and H. Pylori.  Esophageal bx consistent with reflux.   09/2023 Abd / Pelvic CT: Patulous distal esophagus   She is currently taking Nexium  40 Mg twice daily.  Her acid reflux is under excellent control on Nexium  40 Mg twice daily.  She tried Nexium  once daily and Pepcid  in the past which did not control her reflux.   She is here today to evaluate worsening diarrhea with episodes of rectal bleeding.  Patient reports increased diarrhea for 2 months.  For the past few weeks she has noticed mild bright red blood on the tissue after bowel movements.  2 weeks ago she saw a lot of bright red blood in the toilet.  She has episodes of bilateral lower abdominal cramping.  Denies weight loss or family history of IBD.  She has tried IBgard and fiber supplement which helped initially, but is not currently helping.  No recent stool studies.  She has never had a colonoscopy.   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         Current Outpatient Medications  Medication Sig Dispense Refill   albuterol  (PROVENTIL ) (2.5 MG/3ML) 0.083% nebulizer solution Take 3 mLs (2.5 mg total) by nebulization every 6 (six) hours as needed for wheezing or shortness of breath. 150 mL 0   esomeprazole  (NEXIUM ) 40 MG capsule Take 1 capsule (40 mg total) by mouth 2 (two) times daily before a meal. 180 capsule 3   etonogestrel -ethinyl estradiol  (NUVARING) 0.12-0.015 MG/24HR vaginal ring  Insert vaginally and leave in place for 3 consecutive weeks, then remove for 1 week. 9 each 3   hydrOXYzine  (ATARAX ) 10 MG tablet Take 1 tablet (10 mg total) by mouth at bedtime as needed. 60 tablet 3   sertraline  (ZOLOFT ) 50 MG tablet Take 1 tablet (50 mg total) by mouth daily. 30 tablet 5   VENTOLIN  HFA 108 (90 Base) MCG/ACT inhaler Inhale 1 puff into the lungs every 6 (six) hours as needed for wheezing or shortness of breath. 1 each 0      No current facility-administered medications for this visit.             Allergies as of 05/08/2024 - Review Complete 05/08/2024  Allergen Reaction Noted   Shellfish allergy Nausea And Vomiting, Other (See Comments), and Itching 02/06/2016   Duloxetine hcl Anxiety 09/07/2017          Past Medical History:  Diagnosis Date   Anxiety     Asthma     Depression     Gastroenteritis     GERD (gastroesophageal reflux disease)            History reviewed. No pertinent surgical history.       Review of Systems:    All systems reviewed and negative except where noted in HPI.      Physical Exam:  BP 100/70   Pulse  77   Ht 4' 11 (1.499 m)   Wt 142 lb 2 oz (64.5 kg)   BMI 28.71 kg/m  No LMP recorded.   General: Well-nourished, well-developed in no acute distress.  Lungs: Clear to auscultation bilaterally. Non-labored. Heart: Regular rate and rhythm, no murmurs rubs or gallops.  Abdomen: Bowel sounds are normal; Abdomen is Soft; No hepatosplenomegaly, masses or hernias;  Mild Supra-umbilical and bilateral lower Abdominal Tenderness; No guarding or rebound tenderness. Neuro: Alert and oriented x 3.  Grossly intact.  Psych: Alert and cooperative, normal mood and affect.     Imaging Studies: Imaging Results  No results found.     Labs: CBC Labs (Brief)          Component Value Date/Time    WBC 7.4 05/08/2024 1111    RBC 4.43 05/08/2024 1111    HGB 12.4 05/08/2024 1111    HCT 36.4 05/08/2024 1111    PLT 308.0 05/08/2024 1111     MCV 82.1 05/08/2024 1111    MCH 26.7 09/20/2023 2253    MCHC 34.1 05/08/2024 1111    RDW 13.2 05/08/2024 1111    LYMPHSABS 2.9 05/08/2024 1111    MONOABS 0.6 05/08/2024 1111    EOSABS 0.6 05/08/2024 1111    BASOSABS 0.0 05/08/2024 1111        CMP     Labs (Brief)          Component Value Date/Time    NA 140 05/08/2024 1111    NA 139 11/14/2023 1602    K 3.7 05/08/2024 1111    CL 108 05/08/2024 1111    CO2 24 05/08/2024 1111    GLUCOSE 88 05/08/2024 1111    BUN 9 05/08/2024 1111    BUN 14 11/14/2023 1602    CREATININE 0.81 05/08/2024 1111    CALCIUM 8.8 05/08/2024 1111    PROT 6.8 05/08/2024 1111    PROT 6.9 11/14/2023 1602    ALBUMIN 4.2 05/08/2024 1111    ALBUMIN 4.2 11/14/2023 1602    AST 15 05/08/2024 1111    ALT 18 05/08/2024 1111    ALKPHOS 102 05/08/2024 1111    BILITOT 0.3 05/08/2024 1111    BILITOT 0.3 11/14/2023 1602    GFRNONAA >60 09/20/2023 2253              Assessment and Plan:    Madison Vega is a 23 y.o. y/o female presents for worsening diarrhea with new onset of rectal bleeding.  Symptoms are concerning for possible inflammatory bowel disease.  I am scheduling labs, stool test, and colonoscopy for further evaluation.   Diarrhea Rectal Bleeding Lower abdominal Pain 4.   GERD - Controlled on Nexium  40mg  BID   Plan: - Labs: CBC, CMP, CRP, celiac panel - Stool Tests: C. difficile PCR, stool culture, fecal calprotectin - Scheduling Colonoscopy I discussed risks of colonoscopy with patient to include risk of bleeding, colon perforation, and risk of sedation.  Patient expressed understanding and agrees to proceed with colonoscopy.    Ellouise Console, PA-C   Follow up with Dr. Charlanne after Colonoscopy.    Attending physician's note   I have taken history, reviewed the chart and examined the patient. I performed a substantive portion of this encounter, including complete performance of at least one of the key components, in conjunction with the APP.  I agree with the Advanced Practitioner's note, impression and recommendations.   For colon today   Anselm Charlanne, MD [facility] [phone]

## 2024-07-07 ENCOUNTER — Telehealth: Payer: Self-pay | Admitting: *Deleted

## 2024-07-07 NOTE — Telephone Encounter (Signed)
  Follow up Call-     07/04/2024    1:28 PM 11/01/2022    1:00 PM  Call back number  Post procedure Call Back phone  # [phone]  Permission to leave phone message Yes Yes   Attempted to reach pt- call would not go through x3 attempts.  Unable to reach, no message left

## 2024-07-09 LAB — SURGICAL PATHOLOGY

## 2024-07-12 ENCOUNTER — Ambulatory Visit: Payer: Self-pay | Admitting: Gastroenterology

## 2024-07-21 ENCOUNTER — Encounter (HOSPITAL_BASED_OUTPATIENT_CLINIC_OR_DEPARTMENT_OTHER): Payer: Self-pay | Admitting: Family Medicine

## 2024-07-21 ENCOUNTER — Encounter: Payer: Self-pay | Admitting: Radiology

## 2024-07-21 NOTE — Telephone Encounter (Signed)
 Please see mychart message sent by pt and advise.   Looks like pt was scheduled for physical but had to cancel it and has not had it rescheduled.

## 2024-07-22 ENCOUNTER — Other Ambulatory Visit (HOSPITAL_BASED_OUTPATIENT_CLINIC_OR_DEPARTMENT_OTHER): Payer: Self-pay | Admitting: Family Medicine

## 2024-07-22 DIAGNOSIS — F411 Generalized anxiety disorder: Secondary | ICD-10-CM

## 2024-07-22 DIAGNOSIS — Z3044 Encounter for surveillance of vaginal ring hormonal contraceptive device: Secondary | ICD-10-CM

## 2024-07-22 MED ORDER — SERTRALINE HCL 50 MG PO TABS: 50.0000 mg | ORAL_TABLET | Freq: Every day | ORAL | 2 refills | Status: DC

## 2024-07-22 MED ORDER — ETONOGESTREL-ETHINYL ESTRADIOL 0.12-0.015 MG/24HR VA RING: VAGINAL_RING | VAGINAL | 3 refills | Status: AC

## 2024-07-31 ENCOUNTER — Telehealth: Admitting: Physician Assistant

## 2024-07-31 DIAGNOSIS — H60391 Other infective otitis externa, right ear: Secondary | ICD-10-CM

## 2024-08-01 ENCOUNTER — Ambulatory Visit: Payer: Self-pay | Admitting: Gastroenterology

## 2024-08-01 MED ORDER — CIPROFLOXACIN-DEXAMETHASONE 0.3-0.1 % OT SUSP: 4.0000 [drp] | Freq: Two times a day (BID) | OTIC | 0 refills | Status: AC

## 2024-08-01 NOTE — Progress Notes (Signed)
 E Visit for Otitis Externa  We are sorry that you are not feeling well. Here is how we plan to help!  I have prescribed: Ciprofloxacin 0.3% and dexamethasone 0.1% otic suspension four drops in affected ears two times a day for 7 days  In certain cases otitis externa may progress to a more serious bacterial infection of the middle or inner ear.  If you have a fever 102 and up and significantly worsening symptoms, this could indicate a more serious infection moving to the middle/inner and needs face to face evaluation in an office by a provider.  Your symptoms should improve over the next 3 days and should resolve in about 7 days.  HOME CARE:  Wash your hands frequently. Do not place the tip of the bottle on your ear or touch it with your fingers. You can take Acetominophen 650 mg every 4-6 hours as needed for pain.  If pain is severe or moderate, you can apply a heating pad (set on low) or hot water bottle (wrapped in a towel) to outer ear for 20 minutes.  This will also increase drainage. Avoid ear plugs Do not use Q-tips After showers, help the water run out by tilting your head to one side.  GET HELP RIGHT AWAY IF:  Fever is over 102.2 degrees. You develop progressive ear pain or hearing loss. Ear symptoms persist longer than 3 days after treatment.  MAKE SURE YOU:  Understand these instructions. Will watch your condition. Will get help right away if you are not doing well or get worse.  TO PREVENT SWIMMER'S EAR: Use a bathing cap or custom fitted swim molds to keep your ears dry. Towel off after swimming to dry your ears. Tilt your head or pull your earlobes to allow the water to escape your ear canal. If there is still water in your ears, consider using a hairdryer on the lowest setting.  Thank you for choosing an e-visit. Your e-visit answers were reviewed by a board certified advanced clinical practitioner to complete your personal care plan. Depending upon the condition,  your plan could have included both over the counter or prescription medications. Please review your pharmacy choice. Be sure that the pharmacy you have chosen is open so that you can pick up your prescription now.  If there is a problem you may message your provider in MyChart to have the prescription routed to another pharmacy. Your safety is important to us . If you have drug allergies check your prescription carefully.  For the next 24 hours, you can use MyChart to ask questions about today's visit, request a non-urgent call back, or ask for a work or school excuse from your e-visit provider. You will get an email in the next two days asking about your experience. I hope that your e-visit has been valuable and will speed your recovery.   I have spent 5 minutes in review of e-visit questionnaire, review and updating patient chart, medical decision making and response to patient.   Delon CHRISTELLA Dickinson, PA-C

## 2024-08-23 ENCOUNTER — Other Ambulatory Visit (HOSPITAL_BASED_OUTPATIENT_CLINIC_OR_DEPARTMENT_OTHER): Payer: Self-pay | Admitting: Family Medicine

## 2024-08-23 DIAGNOSIS — F411 Generalized anxiety disorder: Secondary | ICD-10-CM

## 2024-09-15 ENCOUNTER — Encounter (HOSPITAL_BASED_OUTPATIENT_CLINIC_OR_DEPARTMENT_OTHER): Payer: Self-pay | Admitting: Family Medicine

## 2024-10-06 ENCOUNTER — Ambulatory Visit (HOSPITAL_BASED_OUTPATIENT_CLINIC_OR_DEPARTMENT_OTHER): Admitting: Family Medicine

## 2024-10-06 ENCOUNTER — Encounter (HOSPITAL_BASED_OUTPATIENT_CLINIC_OR_DEPARTMENT_OTHER): Payer: Self-pay

## 2024-10-19 ENCOUNTER — Telehealth: Payer: Self-pay | Admitting: Family

## 2024-10-19 DIAGNOSIS — J029 Acute pharyngitis, unspecified: Secondary | ICD-10-CM

## 2024-10-19 MED ORDER — AMOXICILLIN 500 MG PO CAPS: 500.0000 mg | ORAL_CAPSULE | Freq: Two times a day (BID) | ORAL | 0 refills | Status: AC

## 2024-10-19 NOTE — Progress Notes (Signed)
